# Patient Record
Sex: Female | Born: 1980 | Race: White | Hispanic: No | Smoking: Former smoker
Health system: Southern US, Community
[De-identification: ages and names within clinical notes are randomized; demographics above are authoritative.]

## PROBLEM LIST (undated history)

## (undated) DIAGNOSIS — D649 Anemia, unspecified: Secondary | ICD-10-CM

## (undated) DIAGNOSIS — Z973 Presence of spectacles and contact lenses: Secondary | ICD-10-CM

## (undated) DIAGNOSIS — N3010 Interstitial cystitis (chronic) without hematuria: Secondary | ICD-10-CM

## (undated) DIAGNOSIS — L120 Bullous pemphigoid: Secondary | ICD-10-CM

## (undated) DIAGNOSIS — R55 Syncope and collapse: Secondary | ICD-10-CM

## (undated) DIAGNOSIS — F419 Anxiety disorder, unspecified: Secondary | ICD-10-CM

## (undated) DIAGNOSIS — F329 Major depressive disorder, single episode, unspecified: Secondary | ICD-10-CM

## (undated) DIAGNOSIS — N809 Endometriosis, unspecified: Secondary | ICD-10-CM

## (undated) DIAGNOSIS — R000 Tachycardia, unspecified: Secondary | ICD-10-CM

## (undated) DIAGNOSIS — F32A Depression, unspecified: Secondary | ICD-10-CM

## (undated) DIAGNOSIS — R519 Headache, unspecified: Secondary | ICD-10-CM

## (undated) HISTORY — PX: TONSILLECTOMY: SUR1361

## (undated) HISTORY — PX: COLONOSCOPY: SHX174

---
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
## 2002-01-29 ENCOUNTER — Other Ambulatory Visit: Admission: RE | Admit: 2002-01-29 | Discharge: 2002-01-29 | Payer: Self-pay | Admitting: Gynecology

## 2002-11-29 ENCOUNTER — Emergency Department (HOSPITAL_COMMUNITY): Admission: EM | Admit: 2002-11-29 | Discharge: 2002-11-29 | Payer: Self-pay | Admitting: Emergency Medicine

## 2002-12-03 ENCOUNTER — Other Ambulatory Visit (HOSPITAL_COMMUNITY): Admission: RE | Admit: 2002-12-03 | Discharge: 2002-12-14 | Payer: Self-pay | Admitting: Psychiatry

## 2003-04-19 ENCOUNTER — Other Ambulatory Visit: Admission: RE | Admit: 2003-04-19 | Discharge: 2003-04-19 | Payer: Self-pay | Admitting: Gynecology

## 2004-06-21 HISTORY — PX: COLONOSCOPY WITH ESOPHAGOGASTRODUODENOSCOPY (EGD): SHX5779

## 2005-03-30 ENCOUNTER — Ambulatory Visit: Payer: Self-pay | Admitting: Internal Medicine

## 2005-04-06 ENCOUNTER — Other Ambulatory Visit: Admission: RE | Admit: 2005-04-06 | Discharge: 2005-04-06 | Payer: Self-pay | Admitting: Gynecology

## 2005-05-26 ENCOUNTER — Ambulatory Visit: Payer: Self-pay | Admitting: Internal Medicine

## 2005-06-18 ENCOUNTER — Ambulatory Visit: Payer: Self-pay | Admitting: Internal Medicine

## 2005-11-22 ENCOUNTER — Ambulatory Visit: Payer: Self-pay | Admitting: Internal Medicine

## 2006-04-12 ENCOUNTER — Ambulatory Visit: Payer: Self-pay | Admitting: Internal Medicine

## 2006-04-20 ENCOUNTER — Other Ambulatory Visit: Admission: RE | Admit: 2006-04-20 | Discharge: 2006-04-20 | Payer: Self-pay | Admitting: Gynecology

## 2006-05-23 ENCOUNTER — Ambulatory Visit: Payer: Self-pay | Admitting: Internal Medicine

## 2006-08-01 ENCOUNTER — Ambulatory Visit: Payer: Self-pay | Admitting: Internal Medicine

## 2006-09-12 ENCOUNTER — Ambulatory Visit: Payer: Self-pay | Admitting: Internal Medicine

## 2006-10-11 DIAGNOSIS — R51 Headache: Secondary | ICD-10-CM

## 2006-10-11 DIAGNOSIS — R519 Headache, unspecified: Secondary | ICD-10-CM | POA: Insufficient documentation

## 2006-10-11 DIAGNOSIS — F411 Generalized anxiety disorder: Secondary | ICD-10-CM | POA: Insufficient documentation

## 2006-10-11 DIAGNOSIS — D649 Anemia, unspecified: Secondary | ICD-10-CM | POA: Insufficient documentation

## 2007-04-07 ENCOUNTER — Telehealth (INDEPENDENT_AMBULATORY_CARE_PROVIDER_SITE_OTHER): Payer: Self-pay | Admitting: *Deleted

## 2007-04-10 ENCOUNTER — Ambulatory Visit: Payer: Self-pay | Admitting: Internal Medicine

## 2007-04-10 DIAGNOSIS — M549 Dorsalgia, unspecified: Secondary | ICD-10-CM | POA: Insufficient documentation

## 2007-04-10 DIAGNOSIS — R7309 Other abnormal glucose: Secondary | ICD-10-CM

## 2007-04-10 LAB — CONVERTED CEMR LAB: Glucose, Bld: 218 mg/dL

## 2007-04-12 ENCOUNTER — Telehealth (INDEPENDENT_AMBULATORY_CARE_PROVIDER_SITE_OTHER): Payer: Self-pay | Admitting: *Deleted

## 2007-04-12 LAB — CONVERTED CEMR LAB
BUN: 8 mg/dL (ref 6–23)
CO2: 30 meq/L (ref 19–32)
Calcium: 9.4 mg/dL (ref 8.4–10.5)
Chloride: 102 meq/L (ref 96–112)
Creatinine, Ser: 0.8 mg/dL (ref 0.4–1.2)
GFR calc Af Amer: 113 mL/min
GFR calc non Af Amer: 94 mL/min
Glucose, Bld: 184 mg/dL — ABNORMAL HIGH (ref 70–99)
Hgb A1c MFr Bld: 5.5 % (ref 4.6–6.0)
Potassium: 4.4 meq/L (ref 3.5–5.1)
Sodium: 138 meq/L (ref 135–145)
TSH: 0.55 microintl units/mL (ref 0.35–5.50)

## 2007-04-13 ENCOUNTER — Encounter (INDEPENDENT_AMBULATORY_CARE_PROVIDER_SITE_OTHER): Payer: Self-pay | Admitting: *Deleted

## 2007-04-25 ENCOUNTER — Other Ambulatory Visit: Admission: RE | Admit: 2007-04-25 | Discharge: 2007-04-25 | Payer: Self-pay | Admitting: Gynecology

## 2007-04-28 ENCOUNTER — Encounter: Payer: Self-pay | Admitting: Internal Medicine

## 2007-05-03 ENCOUNTER — Telehealth: Payer: Self-pay | Admitting: Internal Medicine

## 2007-06-30 ENCOUNTER — Encounter: Payer: Self-pay | Admitting: Internal Medicine

## 2007-07-26 ENCOUNTER — Telehealth: Payer: Self-pay | Admitting: Internal Medicine

## 2007-09-04 ENCOUNTER — Ambulatory Visit: Payer: Self-pay | Admitting: Internal Medicine

## 2007-09-04 ENCOUNTER — Encounter (INDEPENDENT_AMBULATORY_CARE_PROVIDER_SITE_OTHER): Payer: Self-pay | Admitting: *Deleted

## 2007-09-04 LAB — CONVERTED CEMR LAB
Bilirubin Urine: NEGATIVE
Glucose, Urine, Semiquant: NEGATIVE
Inflenza A Ag: NEGATIVE
Ketones, urine, test strip: NEGATIVE
Nitrite: NEGATIVE
Protein, U semiquant: NEGATIVE
Rapid Strep: NEGATIVE
Specific Gravity, Urine: 1.01
Urobilinogen, UA: NEGATIVE

## 2007-09-05 ENCOUNTER — Encounter: Payer: Self-pay | Admitting: Internal Medicine

## 2007-10-06 ENCOUNTER — Telehealth (INDEPENDENT_AMBULATORY_CARE_PROVIDER_SITE_OTHER): Payer: Self-pay | Admitting: *Deleted

## 2007-10-06 ENCOUNTER — Ambulatory Visit: Payer: Self-pay | Admitting: Internal Medicine

## 2007-10-06 DIAGNOSIS — L509 Urticaria, unspecified: Secondary | ICD-10-CM | POA: Insufficient documentation

## 2008-02-21 ENCOUNTER — Encounter (INDEPENDENT_AMBULATORY_CARE_PROVIDER_SITE_OTHER): Payer: Self-pay | Admitting: *Deleted

## 2008-02-21 ENCOUNTER — Ambulatory Visit: Payer: Self-pay | Admitting: Internal Medicine

## 2008-02-21 LAB — CONVERTED CEMR LAB
Bilirubin Urine: NEGATIVE
Blood in Urine, dipstick: NEGATIVE
Glucose, Bld: 87 mg/dL
Glucose, Urine, Semiquant: NEGATIVE
Hemoglobin: 12.7 g/dL
Ketones, urine, test strip: NEGATIVE
Nitrite: NEGATIVE
Protein, U semiquant: NEGATIVE
Specific Gravity, Urine: 1.01
Urobilinogen, UA: 0.2
pH: 8

## 2008-02-22 ENCOUNTER — Telehealth (INDEPENDENT_AMBULATORY_CARE_PROVIDER_SITE_OTHER): Payer: Self-pay | Admitting: *Deleted

## 2008-02-23 ENCOUNTER — Encounter: Payer: Self-pay | Admitting: Internal Medicine

## 2008-02-23 LAB — CONVERTED CEMR LAB: RBC / HPF: NONE SEEN (ref <3)

## 2008-03-12 ENCOUNTER — Emergency Department (HOSPITAL_COMMUNITY): Admission: EM | Admit: 2008-03-12 | Discharge: 2008-03-12 | Payer: Self-pay | Admitting: Emergency Medicine

## 2008-03-22 ENCOUNTER — Ambulatory Visit: Payer: Self-pay | Admitting: Internal Medicine

## 2008-03-22 DIAGNOSIS — S139XXA Sprain of joints and ligaments of unspecified parts of neck, initial encounter: Secondary | ICD-10-CM | POA: Insufficient documentation

## 2008-04-02 ENCOUNTER — Telehealth: Payer: Self-pay | Admitting: Internal Medicine

## 2008-04-03 ENCOUNTER — Encounter (INDEPENDENT_AMBULATORY_CARE_PROVIDER_SITE_OTHER): Payer: Self-pay | Admitting: *Deleted

## 2008-04-17 ENCOUNTER — Ambulatory Visit: Payer: Self-pay | Admitting: Family Medicine

## 2008-04-18 ENCOUNTER — Telehealth (INDEPENDENT_AMBULATORY_CARE_PROVIDER_SITE_OTHER): Payer: Self-pay | Admitting: *Deleted

## 2008-04-18 ENCOUNTER — Encounter (INDEPENDENT_AMBULATORY_CARE_PROVIDER_SITE_OTHER): Payer: Self-pay | Admitting: *Deleted

## 2008-04-23 ENCOUNTER — Ambulatory Visit: Payer: Self-pay | Admitting: Family Medicine

## 2008-04-23 DIAGNOSIS — J020 Streptococcal pharyngitis: Secondary | ICD-10-CM | POA: Insufficient documentation

## 2008-04-23 LAB — CONVERTED CEMR LAB: Rapid Strep: POSITIVE

## 2008-05-06 ENCOUNTER — Encounter (INDEPENDENT_AMBULATORY_CARE_PROVIDER_SITE_OTHER): Payer: Self-pay | Admitting: *Deleted

## 2008-05-06 ENCOUNTER — Ambulatory Visit: Payer: Self-pay | Admitting: Internal Medicine

## 2008-05-07 ENCOUNTER — Telehealth (INDEPENDENT_AMBULATORY_CARE_PROVIDER_SITE_OTHER): Payer: Self-pay | Admitting: *Deleted

## 2008-05-08 ENCOUNTER — Encounter (INDEPENDENT_AMBULATORY_CARE_PROVIDER_SITE_OTHER): Payer: Self-pay | Admitting: *Deleted

## 2008-05-09 ENCOUNTER — Encounter (INDEPENDENT_AMBULATORY_CARE_PROVIDER_SITE_OTHER): Payer: Self-pay | Admitting: *Deleted

## 2008-05-14 ENCOUNTER — Encounter: Payer: Self-pay | Admitting: Internal Medicine

## 2008-06-21 HISTORY — PX: PELVIC LAPAROSCOPY: SHX162

## 2008-06-25 ENCOUNTER — Emergency Department (HOSPITAL_COMMUNITY): Admission: EM | Admit: 2008-06-25 | Discharge: 2008-06-25 | Payer: Self-pay | Admitting: Emergency Medicine

## 2008-06-26 ENCOUNTER — Ambulatory Visit: Payer: Self-pay | Admitting: Radiology

## 2008-06-26 ENCOUNTER — Emergency Department (HOSPITAL_BASED_OUTPATIENT_CLINIC_OR_DEPARTMENT_OTHER): Admission: EM | Admit: 2008-06-26 | Discharge: 2008-06-26 | Payer: Self-pay | Admitting: Emergency Medicine

## 2008-07-22 ENCOUNTER — Encounter: Admission: RE | Admit: 2008-07-22 | Discharge: 2008-07-22 | Payer: Self-pay | Admitting: Gastroenterology

## 2008-07-29 ENCOUNTER — Encounter: Payer: Self-pay | Admitting: Internal Medicine

## 2008-08-08 ENCOUNTER — Ambulatory Visit (HOSPITAL_COMMUNITY): Admission: RE | Admit: 2008-08-08 | Discharge: 2008-08-08 | Payer: Self-pay | Admitting: Obstetrics and Gynecology

## 2008-08-22 ENCOUNTER — Ambulatory Visit: Payer: Self-pay | Admitting: Internal Medicine

## 2008-08-22 ENCOUNTER — Observation Stay (HOSPITAL_COMMUNITY): Admission: EM | Admit: 2008-08-22 | Discharge: 2008-08-23 | Payer: Self-pay | Admitting: Emergency Medicine

## 2008-09-05 DIAGNOSIS — R002 Palpitations: Secondary | ICD-10-CM

## 2008-09-06 ENCOUNTER — Ambulatory Visit: Payer: Self-pay | Admitting: Internal Medicine

## 2008-09-06 ENCOUNTER — Encounter: Payer: Self-pay | Admitting: Internal Medicine

## 2008-10-23 ENCOUNTER — Encounter (INDEPENDENT_AMBULATORY_CARE_PROVIDER_SITE_OTHER): Payer: Self-pay | Admitting: *Deleted

## 2008-12-19 ENCOUNTER — Ambulatory Visit: Payer: Self-pay | Admitting: Internal Medicine

## 2008-12-19 DIAGNOSIS — R55 Syncope and collapse: Secondary | ICD-10-CM | POA: Insufficient documentation

## 2009-04-27 ENCOUNTER — Emergency Department (HOSPITAL_BASED_OUTPATIENT_CLINIC_OR_DEPARTMENT_OTHER): Admission: EM | Admit: 2009-04-27 | Discharge: 2009-04-27 | Payer: Self-pay | Admitting: Emergency Medicine

## 2009-05-04 IMAGING — CT CT ENTERO ABD W/CM
2 of 5 series · 17 of 46 positions shown, 19 images · IV contrast (VOLUMEN & [ID] OMNI 300)
Comparison: CT abdomen pelvis of 06/26/2008

CT ABDOMEN:

CLINICAL DATA: Abdominal pain, diarrhea for 5 weeks, some weight
loss

CT ABDOMEN AND PELVIS WITH CONTRAST (CT ENTEROGRAPHY)
TECHNIQUE: Multidetector CT of the abdomen and pelvis during bolus
administration of intravenous contrast. Negative oral contrast
VoLumen was given.
Contrast:  100 ml Umnipaque-444 intravenously and 6630 ml VoLumen
orally.

[Series 3: enterography · axial · 0.64mm/px · z∈[-343,-0]mm · 14 of 155 slices shown, 16 images]
[im 9/155  soft-tissue]
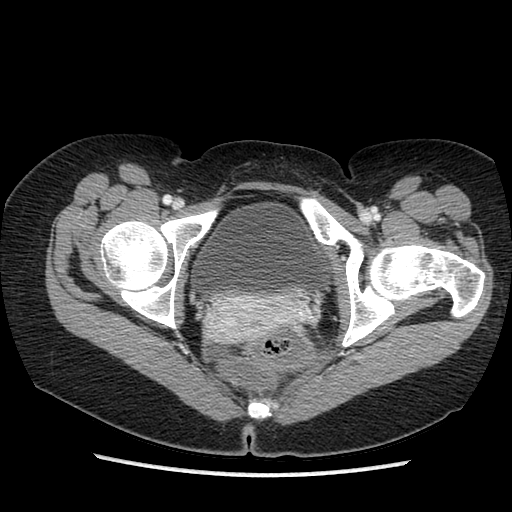
[im 9/155  bone]
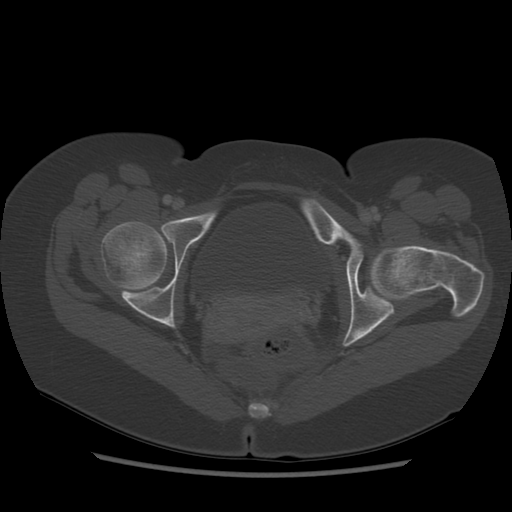
[im 18/155  soft-tissue]
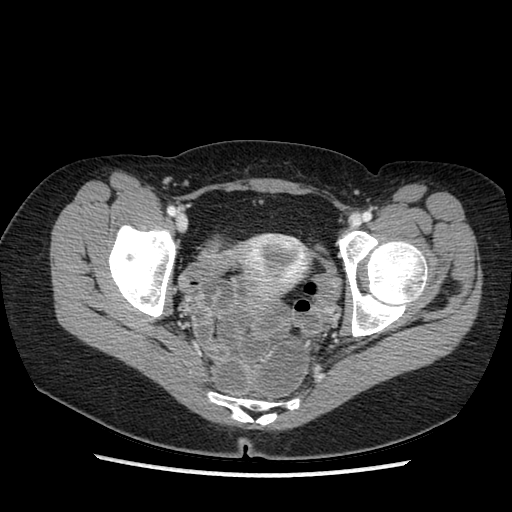
[im 35/155  soft-tissue]
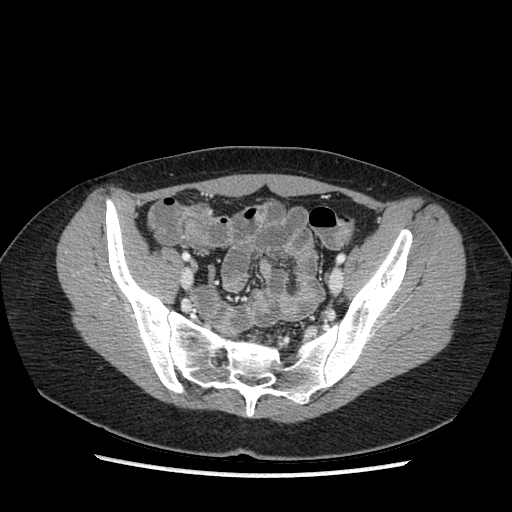
[im 43/155  soft-tissue]
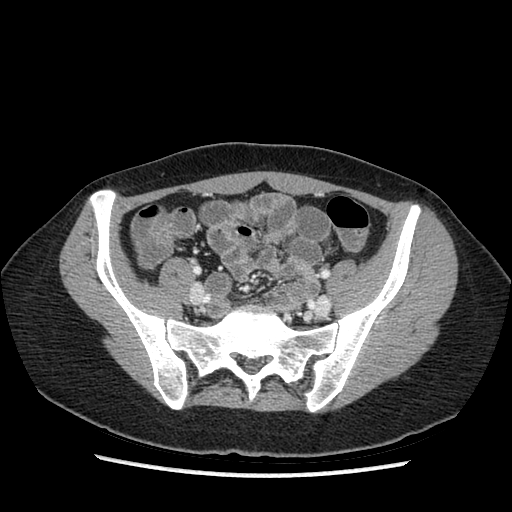
[im 52/155  soft-tissue]
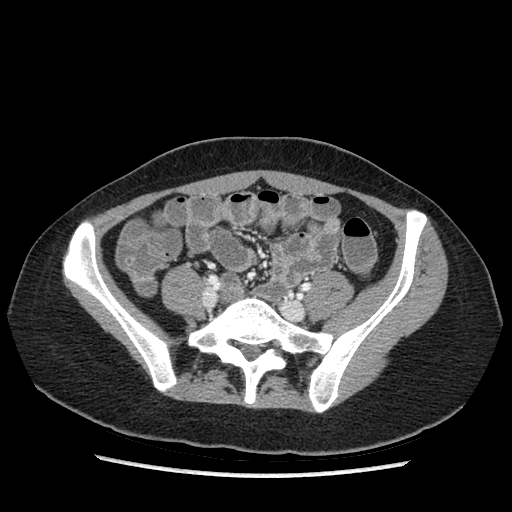
[im 60/155  soft-tissue]
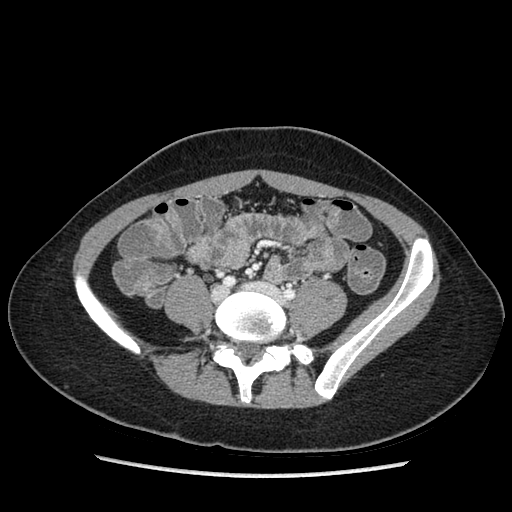
[im 69/155  soft-tissue]
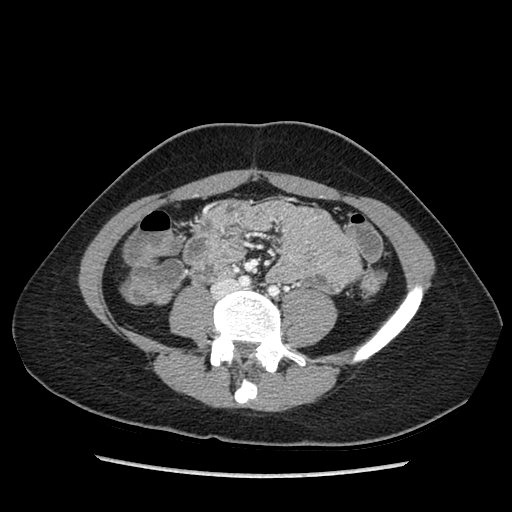
[im 86/155  soft-tissue]
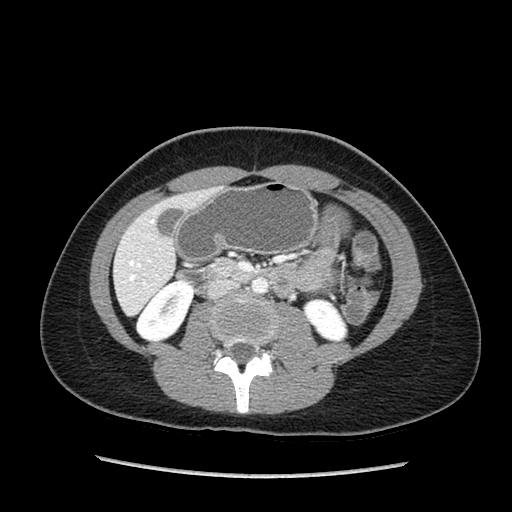
[im 95/155  soft-tissue]
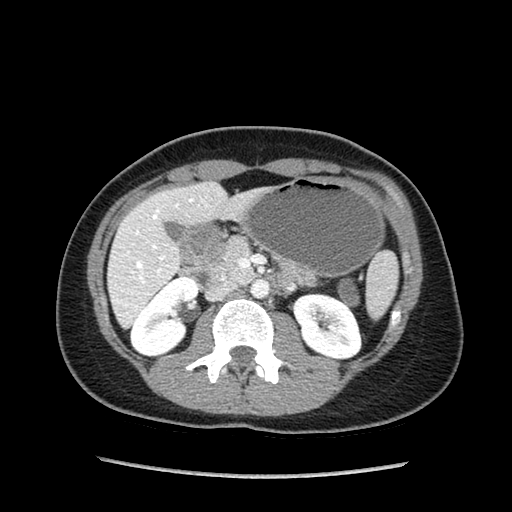
[im 95/155  bone]
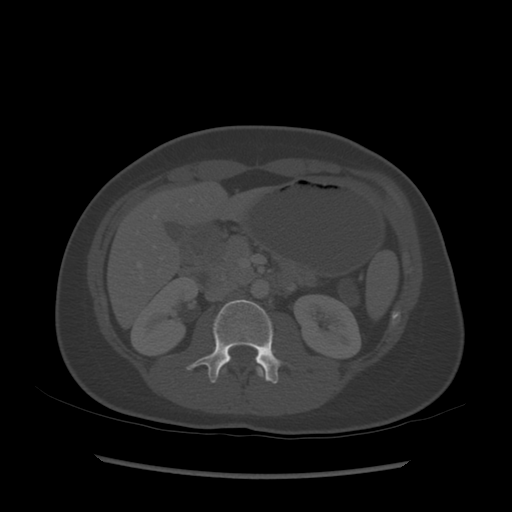
[im 103/155  soft-tissue]
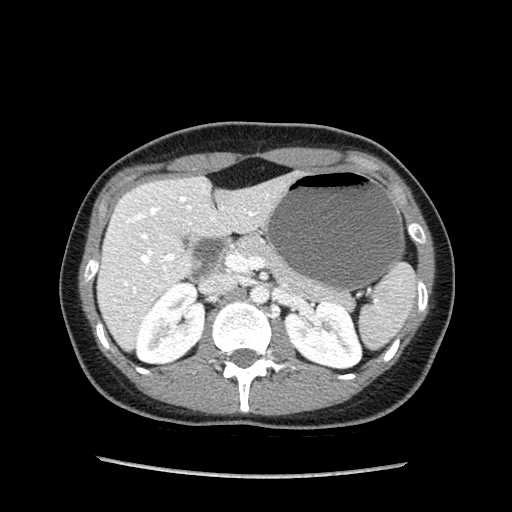
[im 112/155  soft-tissue]
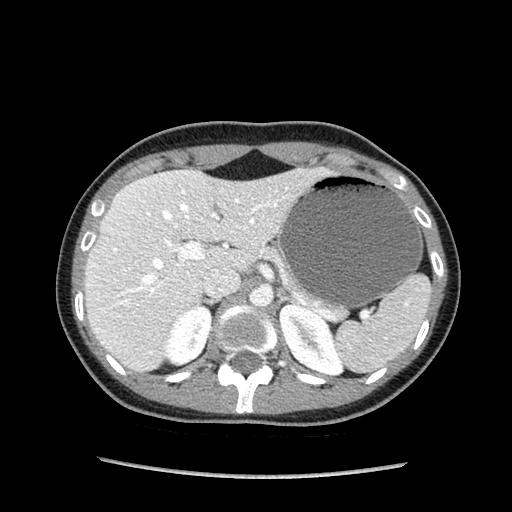
[im 120/155  soft-tissue]
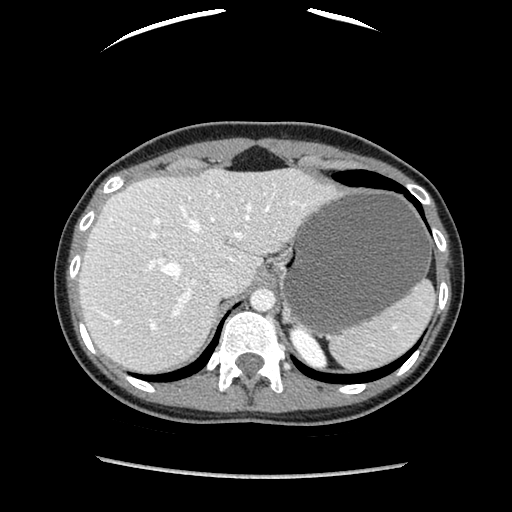
[im 137/155  soft-tissue]
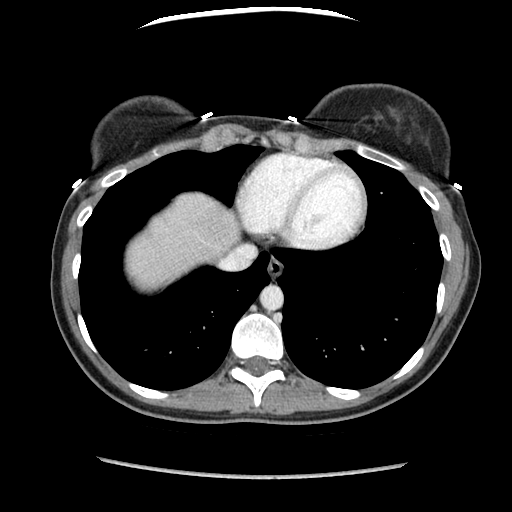
[im 146/155  soft-tissue]
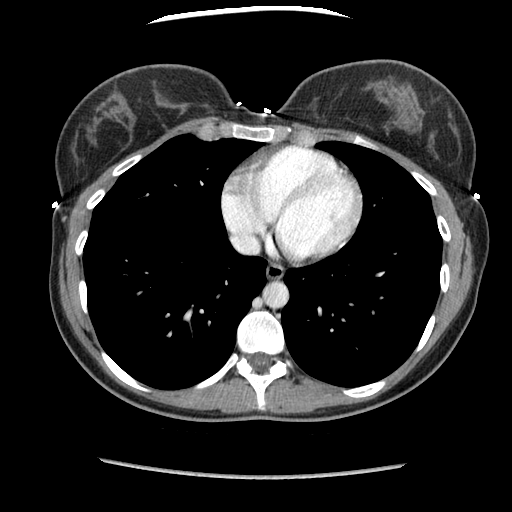

[Series 602: sagittal body · sagittal · 0.84mm/px · 3 of 133 slices shown]
[im 45/133  soft-tissue]
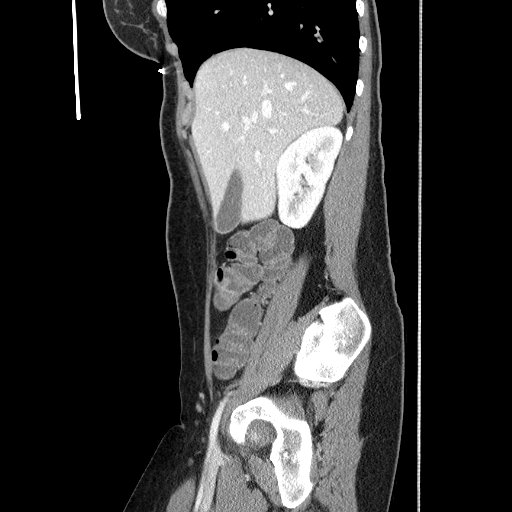
[im 59/133  soft-tissue]
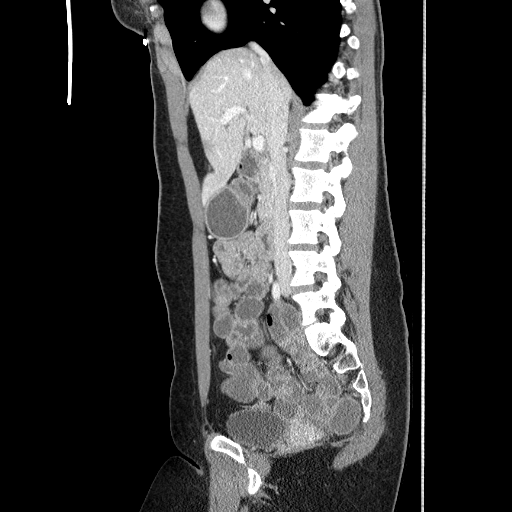
[im 74/133  soft-tissue]
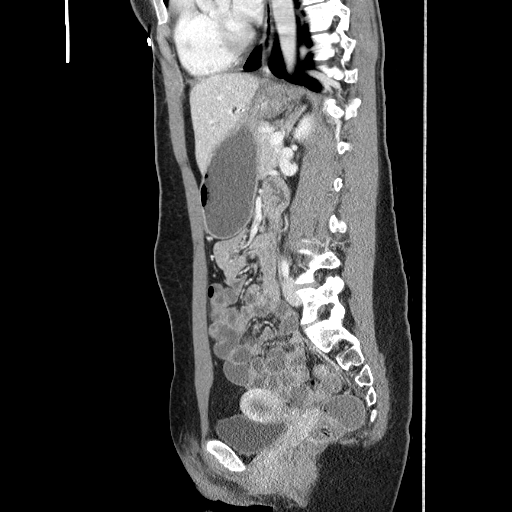

[17 of 46 positions shown; findings below may reference images not displayed]

FINDINGS: The lung bases are clear.  The liver enhances with no
focal abnormality and no ductal dilatation is seen.  No calcified
gallstones are noted.  The pancreas is normal in size and the
pancreatic duct is not dilated.  The adrenal glands and spleen
appear normal.  The kidneys enhance and on delayed images the
pelvocaliceal systems appear normal.  The proximal ureters are not
dilated.  The abdominal aorta is normal in caliber.  No adenopathy
is seen.
IMPRESSION: No significant abnormality on CT of the abdomen.

CT PELVIS:
FINDINGS: The terminal ileum is well visualized, and well distended
by Volumen.  There is no evidence of mural enhancement and no
mucosal edema is seen.  The colon appears grossly normal.  The
uterus is normal in size.  No adnexal lesion is seen and no free
fluid is noted.  The appendix is faintly visualized and appears
normal.  No bony abnormality is seen.
IMPRESSION: No significant abnormality on CT of the pelvis.  The terminal ileum
is well seen and well distended, with no abnormality noted.

REF:G3 DICTATED: 07/22/2008 [DATE]

## 2009-05-05 ENCOUNTER — Emergency Department (HOSPITAL_BASED_OUTPATIENT_CLINIC_OR_DEPARTMENT_OTHER): Admission: EM | Admit: 2009-05-05 | Discharge: 2009-05-05 | Payer: Self-pay | Admitting: Emergency Medicine

## 2009-08-19 ENCOUNTER — Telehealth: Payer: Self-pay | Admitting: Internal Medicine

## 2010-01-21 ENCOUNTER — Emergency Department (HOSPITAL_BASED_OUTPATIENT_CLINIC_OR_DEPARTMENT_OTHER): Admission: EM | Admit: 2010-01-21 | Discharge: 2010-01-21 | Payer: Self-pay | Admitting: Emergency Medicine

## 2010-02-04 ENCOUNTER — Emergency Department (HOSPITAL_BASED_OUTPATIENT_CLINIC_OR_DEPARTMENT_OTHER): Admission: EM | Admit: 2010-02-04 | Discharge: 2010-02-04 | Payer: Self-pay | Admitting: Emergency Medicine

## 2010-02-05 ENCOUNTER — Emergency Department (HOSPITAL_BASED_OUTPATIENT_CLINIC_OR_DEPARTMENT_OTHER): Admission: EM | Admit: 2010-02-05 | Discharge: 2010-02-05 | Payer: Self-pay | Admitting: Emergency Medicine

## 2010-02-18 ENCOUNTER — Emergency Department (HOSPITAL_BASED_OUTPATIENT_CLINIC_OR_DEPARTMENT_OTHER): Admission: EM | Admit: 2010-02-18 | Discharge: 2010-02-18 | Payer: Self-pay | Admitting: Emergency Medicine

## 2010-02-18 ENCOUNTER — Encounter (INDEPENDENT_AMBULATORY_CARE_PROVIDER_SITE_OTHER): Payer: Self-pay | Admitting: *Deleted

## 2010-02-18 ENCOUNTER — Telehealth: Payer: Self-pay | Admitting: Internal Medicine

## 2010-03-21 ENCOUNTER — Ambulatory Visit: Payer: Self-pay | Admitting: Diagnostic Radiology

## 2010-03-21 ENCOUNTER — Emergency Department (HOSPITAL_BASED_OUTPATIENT_CLINIC_OR_DEPARTMENT_OTHER): Admission: EM | Admit: 2010-03-21 | Discharge: 2010-03-21 | Payer: Self-pay | Admitting: Emergency Medicine

## 2010-04-14 ENCOUNTER — Ambulatory Visit: Payer: Self-pay | Admitting: Diagnostic Radiology

## 2010-04-14 ENCOUNTER — Emergency Department (HOSPITAL_BASED_OUTPATIENT_CLINIC_OR_DEPARTMENT_OTHER): Admission: EM | Admit: 2010-04-14 | Discharge: 2010-04-15 | Payer: Self-pay | Admitting: Emergency Medicine

## 2010-06-20 ENCOUNTER — Emergency Department (HOSPITAL_BASED_OUTPATIENT_CLINIC_OR_DEPARTMENT_OTHER)
Admission: EM | Admit: 2010-06-20 | Discharge: 2010-06-20 | Payer: Self-pay | Source: Home / Self Care | Admitting: Emergency Medicine

## 2010-07-21 NOTE — Miscellaneous (Signed)
Summary: Florinef 0.1mg  bid  Clinical Lists Changes  Medications: Added new medication of FLUDROCORTISONE ACETATE 0.1 MG TABS (FLUDROCORTISONE ACETATE) one by mouth bid - Signed Rx of FLUDROCORTISONE ACETATE 0.1 MG TABS (FLUDROCORTISONE ACETATE) one by mouth bid;  #60 x 3;  Signed;  Entered by: Dennis Bast, RN, BSN;  Authorized by: Laren Boom, MD, [facility];  Method used: Electronically to [facility] [phone]*, [address], [place], [place], [place]  [address], Ph: [phone], Fax: [phone]    Prescriptions: FLUDROCORTISONE ACETATE 0.1 MG TABS (FLUDROCORTISONE ACETATE) one by mouth bid  #60 x 3   Entered by:   Dennis Bast, RN, BSN   Authorized by:   Laren Boom, MD, [facility]   Signed by:   Dennis Bast, RN, BSN on 02/18/2010   Method used:   Electronically to        [facility] [phone]* (retail)       [address]       [place], [place]  [address]       Ph: [phone]       Fax: [phone]   RxID:   [id number]

## 2010-07-21 NOTE — Progress Notes (Signed)
Summary: letter for work  Phone Note Call from Patient Call back at [employer] [phone]   Caller: Patient Reason for Call: Talk to Nurse Summary of Call: almost blacked out at work, will need letter to go back for tomorrow Initial call taken by: Migdalia Dk,  August 19, 2009 12:13 PM  Follow-up for Phone Call        started having palpatations today then  got dizzy.  HR was at 200 bpm per a therapist.  Has been taking Miralax over the last couple of days. Needs to be rehydrated and may return to work on 08/20/09.  Fax note [phone] atn. Nyra Capes. Explained to pt that she can't take medication such as Miralax with her condition.  She understands Dennis Bast, RN, BSN  August 19, 2009 12:45 PM

## 2010-07-21 NOTE — Progress Notes (Signed)
Summary: wants to be admitted for panic like attacks associated w/syncope  Phone Note Call from Patient   Caller: Patient Reason for Call: Talk to Nurse Summary of Call: pt wants dr taylor to admit her to [facility] today-having syncope-with panic like attacks -[phone] Initial call taken by: Glynda Jaeger,  February 18, 2010 9:31 AM  Follow-up for Phone Call        pt calling back-wants to be admitted as soon as possible Glynda Jaeger  February 18, 2010 10:08 AM 02/18/10--10:am--pt calling stating she is having syncope, nausea, and she can't keep anything down-- states she has "passed out" 2 x today--psychiatrist stated she should call dr taylor--pt states she just wants to be admitted to hospital to figure out what's going on, and doesn't want to go to ED for fluids and then be sent home--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  I explained to her that Dr Ladona Ridgel wanted to start her on Florinef 0.1mg  two times a day and to call me after her apt to let me know what is going on with her other medications.  She agreed to do so

## 2010-07-26 ENCOUNTER — Emergency Department (HOSPITAL_BASED_OUTPATIENT_CLINIC_OR_DEPARTMENT_OTHER)
Admission: EM | Admit: 2010-07-26 | Discharge: 2010-07-26 | Disposition: A | Discharge status: Home / Self Care | Payer: BC Managed Care – PPO | Attending: Emergency Medicine | Admitting: Emergency Medicine

## 2010-07-26 DIAGNOSIS — T1500XA Foreign body in cornea, unspecified eye, initial encounter: Secondary | ICD-10-CM | POA: Insufficient documentation

## 2010-07-26 DIAGNOSIS — IMO0002 Reserved for concepts with insufficient information to code with codable children: Secondary | ICD-10-CM | POA: Insufficient documentation

## 2010-07-26 DIAGNOSIS — Y92009 Unspecified place in unspecified non-institutional (private) residence as the place of occurrence of the external cause: Secondary | ICD-10-CM | POA: Insufficient documentation

## 2010-07-26 DIAGNOSIS — H5710 Ocular pain, unspecified eye: Secondary | ICD-10-CM | POA: Insufficient documentation

## 2010-08-31 LAB — GC/CHLAMYDIA PROBE AMP, GENITAL
Chlamydia, DNA Probe: NEGATIVE
GC Probe Amp, Genital: NEGATIVE

## 2010-08-31 LAB — PREGNANCY, URINE: Preg Test, Ur: NEGATIVE

## 2010-08-31 LAB — URINALYSIS, ROUTINE W REFLEX MICROSCOPIC
Bilirubin Urine: NEGATIVE
Glucose, UA: NEGATIVE mg/dL
Ketones, ur: 15 mg/dL — AB
Nitrite: NEGATIVE
Protein, ur: 100 mg/dL — AB
Specific Gravity, Urine: 1.019 (ref 1.005–1.030)
Urobilinogen, UA: 0.2 mg/dL (ref 0.0–1.0)
pH: 7 (ref 5.0–8.0)

## 2010-08-31 LAB — WET PREP, GENITAL
Trich, Wet Prep: NONE SEEN
Yeast Wet Prep HPF POC: NONE SEEN

## 2010-08-31 LAB — URINE MICROSCOPIC-ADD ON

## 2010-08-31 LAB — URINE CULTURE
Colony Count: >=100000
Culture  Setup Time: 201112312350

## 2010-09-02 LAB — CBC
HCT: 40 % (ref 36.0–46.0)
Hemoglobin: 13.9 g/dL (ref 12.0–15.0)
MCH: 30.9 pg (ref 26.0–34.0)
MCHC: 34.6 g/dL (ref 30.0–36.0)
MCV: 89.1 fL (ref 78.0–100.0)
Platelets: 255 10*3/uL (ref 150–400)
RBC: 4.49 MIL/uL (ref 3.87–5.11)
RDW: 12.4 % (ref 11.5–15.5)
WBC: 8.9 10*3/uL (ref 4.0–10.5)

## 2010-09-02 LAB — COMPREHENSIVE METABOLIC PANEL
ALT: 36 U/L — ABNORMAL HIGH (ref 0–35)
AST: 31 U/L (ref 0–37)
Albumin: 4.8 g/dL (ref 3.5–5.2)
Alkaline Phosphatase: 61 U/L (ref 39–117)
BUN: 18 mg/dL (ref 6–23)
CO2: 26 mEq/L (ref 19–32)
Calcium: 9.8 mg/dL (ref 8.4–10.5)
Chloride: 102 mEq/L (ref 96–112)
Creatinine, Ser: 0.6 mg/dL (ref 0.4–1.2)
GFR calc Af Amer: >60 mL/min (ref >60)
GFR calc non Af Amer: >60 mL/min (ref >60)
Glucose, Bld: 83 mg/dL (ref 70–99)
Potassium: 3.9 mEq/L (ref 3.5–5.1)
Sodium: 141 mEq/L (ref 135–145)
Total Bilirubin: 0.9 mg/dL (ref 0.3–1.2)
Total Protein: 8.1 g/dL (ref 6.0–8.3)

## 2010-09-02 LAB — DIFFERENTIAL
Basophils Absolute: 0.2 10*3/uL — ABNORMAL HIGH (ref 0.0–0.1)
Basophils Relative: 3 % — ABNORMAL HIGH (ref 0–1)
Lymphocytes Relative: 34 % (ref 12–46)
Neutro Abs: 5 10*3/uL (ref 1.7–7.7)
Neutrophils Relative %: 57 % (ref 43–77)

## 2010-09-02 LAB — URINALYSIS, ROUTINE W REFLEX MICROSCOPIC
Bilirubin Urine: NEGATIVE
Glucose, UA: NEGATIVE mg/dL
Hgb urine dipstick: NEGATIVE
Ketones, ur: NEGATIVE mg/dL
Nitrite: NEGATIVE
Protein, ur: NEGATIVE mg/dL
Specific Gravity, Urine: 1.011 (ref 1.005–1.030)
Urobilinogen, UA: 0.2 mg/dL (ref 0.0–1.0)
pH: 8.5 — ABNORMAL HIGH (ref 5.0–8.0)

## 2010-09-02 LAB — LIPASE, BLOOD: Lipase: 95 U/L (ref 23–300)

## 2010-09-02 LAB — PREGNANCY, URINE: Preg Test, Ur: NEGATIVE

## 2010-09-03 LAB — COMPREHENSIVE METABOLIC PANEL
ALT: 35 U/L (ref 0–35)
AST: 29 U/L (ref 0–37)
Albumin: 4.3 g/dL (ref 3.5–5.2)
Alkaline Phosphatase: 61 U/L (ref 39–117)
CO2: 26 mEq/L (ref 19–32)
Chloride: 104 mEq/L (ref 96–112)
GFR calc Af Amer: >60 mL/min (ref >60)
GFR calc non Af Amer: >60 mL/min (ref >60)
Potassium: 4.1 mEq/L (ref 3.5–5.1)
Total Bilirubin: 0.8 mg/dL (ref 0.3–1.2)

## 2010-09-03 LAB — WET PREP, GENITAL
Clue Cells Wet Prep HPF POC: NONE SEEN
Trich, Wet Prep: NONE SEEN

## 2010-09-03 LAB — DIFFERENTIAL
Basophils Absolute: 0.2 10*3/uL — ABNORMAL HIGH (ref 0.0–0.1)
Basophils Relative: 2 % — ABNORMAL HIGH (ref 0–1)
Eosinophils Absolute: 0.1 10*3/uL (ref 0.0–0.7)
Eosinophils Relative: 2 % (ref 0–5)
Monocytes Absolute: 0.4 10*3/uL (ref 0.1–1.0)

## 2010-09-03 LAB — URINALYSIS, ROUTINE W REFLEX MICROSCOPIC
Glucose, UA: NEGATIVE mg/dL
Hgb urine dipstick: NEGATIVE
Ketones, ur: NEGATIVE mg/dL
Protein, ur: NEGATIVE mg/dL
Urobilinogen, UA: 0.2 mg/dL (ref 0.0–1.0)

## 2010-09-03 LAB — CBC
Hemoglobin: 13.9 g/dL (ref 12.0–15.0)
MCH: 29.8 pg (ref 26.0–34.0)
Platelets: 266 10*3/uL (ref 150–400)
RBC: 4.67 MIL/uL (ref 3.87–5.11)
WBC: 8.5 10*3/uL (ref 4.0–10.5)

## 2010-09-03 LAB — GLUCOSE, CAPILLARY: Glucose-Capillary: 72 mg/dL (ref 70–99)

## 2010-09-03 LAB — GC/CHLAMYDIA PROBE AMP, GENITAL: GC Probe Amp, Genital: NEGATIVE

## 2010-09-04 LAB — URINE MICROSCOPIC-ADD ON

## 2010-09-04 LAB — BASIC METABOLIC PANEL
BUN: 11 mg/dL (ref 6–23)
BUN: 9 mg/dL (ref 6–23)
Calcium: 10.4 mg/dL (ref 8.4–10.5)
Calcium: 9.7 mg/dL (ref 8.4–10.5)
Chloride: 101 mEq/L (ref 96–112)
Creatinine, Ser: 0.8 mg/dL (ref 0.4–1.2)
Creatinine, Ser: 0.8 mg/dL (ref 0.4–1.2)
Creatinine, Ser: 0.9 mg/dL (ref 0.4–1.2)
GFR calc Af Amer: >60 mL/min (ref >60)
GFR calc non Af Amer: >60 mL/min (ref >60)
GFR calc non Af Amer: >60 mL/min (ref >60)
GFR calc non Af Amer: >60 mL/min (ref >60)
Glucose, Bld: 86 mg/dL (ref 70–99)
Potassium: 3.5 mEq/L (ref 3.5–5.1)

## 2010-09-04 LAB — URINALYSIS, ROUTINE W REFLEX MICROSCOPIC
Bilirubin Urine: NEGATIVE
Bilirubin Urine: NEGATIVE
Hgb urine dipstick: NEGATIVE
Hgb urine dipstick: NEGATIVE
Ketones, ur: 15 mg/dL — AB
Ketones, ur: >80 mg/dL — AB
Ketones, ur: 15 mg/dL — AB
Nitrite: NEGATIVE
Protein, ur: NEGATIVE mg/dL
Specific Gravity, Urine: 1.02 (ref 1.005–1.030)
Specific Gravity, Urine: 1.022 (ref 1.005–1.030)
Urobilinogen, UA: 1 mg/dL (ref 0.0–1.0)
Urobilinogen, UA: 1 mg/dL (ref 0.0–1.0)
pH: 7.5 (ref 5.0–8.0)

## 2010-09-04 LAB — PREGNANCY, URINE: Preg Test, Ur: NEGATIVE

## 2010-09-04 LAB — CBC
MCV: 88.6 fL (ref 78.0–100.0)
Platelets: 276 10*3/uL (ref 150–400)
RDW: 12.9 % (ref 11.5–15.5)
WBC: 7.5 10*3/uL (ref 4.0–10.5)

## 2010-09-04 LAB — URINE CULTURE
Colony Count: 40000
Culture  Setup Time: 201108181804

## 2010-09-04 LAB — POCT TOXICOLOGY PANEL: Benzodiazepines: POSITIVE

## 2010-09-04 LAB — DIFFERENTIAL
Basophils Absolute: 0.1 10*3/uL (ref 0.0–0.1)
Eosinophils Relative: 2 % (ref 0–5)
Lymphocytes Relative: 26 % (ref 12–46)

## 2010-09-23 LAB — URINALYSIS, ROUTINE W REFLEX MICROSCOPIC
Bilirubin Urine: NEGATIVE
Hgb urine dipstick: NEGATIVE
Ketones, ur: NEGATIVE mg/dL
Nitrite: NEGATIVE
Protein, ur: NEGATIVE mg/dL
Urobilinogen, UA: 1 mg/dL (ref 0.0–1.0)

## 2010-10-01 LAB — POCT I-STAT, CHEM 8
BUN: 7 mg/dL (ref 6–23)
Calcium, Ion: 1.13 mmol/L (ref 1.12–1.32)
Chloride: 109 mEq/L (ref 96–112)
Glucose, Bld: 109 mg/dL — ABNORMAL HIGH (ref 70–99)

## 2010-10-01 LAB — BASIC METABOLIC PANEL
BUN: 6 mg/dL (ref 6–23)
BUN: 8 mg/dL (ref 6–23)
CO2: 19 mEq/L (ref 19–32)
Chloride: 114 mEq/L — ABNORMAL HIGH (ref 96–112)
Creatinine, Ser: 0.76 mg/dL (ref 0.4–1.2)
Creatinine, Ser: 0.85 mg/dL (ref 0.4–1.2)
GFR calc non Af Amer: >60 mL/min (ref >60)
Potassium: 3.6 mEq/L (ref 3.5–5.1)

## 2010-10-01 LAB — HEPATIC FUNCTION PANEL
ALT: 228 U/L — ABNORMAL HIGH (ref 0–35)
Alkaline Phosphatase: 61 U/L (ref 39–117)
Bilirubin, Direct: 0.2 mg/dL (ref 0.0–0.3)
Indirect Bilirubin: 0.8 mg/dL (ref 0.3–0.9)

## 2010-10-01 LAB — DIFFERENTIAL
Basophils Absolute: 0 10*3/uL (ref 0.0–0.1)
Eosinophils Absolute: 0.1 10*3/uL (ref 0.0–0.7)
Eosinophils Relative: 1 % (ref 0–5)
Lymphocytes Relative: 26 % (ref 12–46)
Monocytes Absolute: 0.3 10*3/uL (ref 0.1–1.0)

## 2010-10-01 LAB — URINALYSIS, ROUTINE W REFLEX MICROSCOPIC
Glucose, UA: NEGATIVE mg/dL
Hgb urine dipstick: NEGATIVE
Ketones, ur: 15 mg/dL — AB
pH: 5.5 (ref 5.0–8.0)

## 2010-10-01 LAB — CBC
HCT: 40.5 % (ref 36.0–46.0)
Hemoglobin: 13.5 g/dL (ref 12.0–15.0)
MCV: 88.1 fL (ref 78.0–100.0)
RDW: 14.1 % (ref 11.5–15.5)

## 2010-10-01 LAB — CK: Total CK: 53 U/L (ref 7–177)

## 2010-10-05 LAB — POCT I-STAT, CHEM 8
BUN: 7 mg/dL (ref 6–23)
Calcium, Ion: 1.09 mmol/L — ABNORMAL LOW (ref 1.12–1.32)
Chloride: 109 mEq/L (ref 96–112)

## 2010-10-05 LAB — BASIC METABOLIC PANEL
GFR calc Af Amer: >60 mL/min (ref >60)
GFR calc non Af Amer: >60 mL/min (ref >60)
Glucose, Bld: 72 mg/dL (ref 70–99)
Potassium: 4.5 mEq/L (ref 3.5–5.1)
Sodium: 139 mEq/L (ref 135–145)

## 2010-10-05 LAB — DIFFERENTIAL
Basophils Absolute: 0.2 10*3/uL — ABNORMAL HIGH (ref 0.0–0.1)
Basophils Relative: 2 % — ABNORMAL HIGH (ref 0–1)
Eosinophils Absolute: 0.1 10*3/uL (ref 0.0–0.7)
Eosinophils Relative: 1 % (ref 0–5)

## 2010-10-05 LAB — URINALYSIS, ROUTINE W REFLEX MICROSCOPIC
Bilirubin Urine: NEGATIVE
Bilirubin Urine: NEGATIVE
Glucose, UA: NEGATIVE mg/dL
Ketones, ur: 40 mg/dL — AB
Nitrite: NEGATIVE
Specific Gravity, Urine: 1.007 (ref 1.005–1.030)
pH: 7 (ref 5.0–8.0)
pH: 7 (ref 5.0–8.0)

## 2010-10-05 LAB — CBC
HCT: 39.8 % (ref 36.0–46.0)
Hemoglobin: 13.6 g/dL (ref 12.0–15.0)
MCHC: 34.1 g/dL (ref 30.0–36.0)
MCV: 87.7 fL (ref 78.0–100.0)
Platelets: 280 10*3/uL (ref 150–400)
RBC: 4.54 MIL/uL (ref 3.87–5.11)
RDW: 12.7 % (ref 11.5–15.5)
WBC: 10.5 10*3/uL (ref 4.0–10.5)

## 2010-10-05 LAB — PREGNANCY, URINE: Preg Test, Ur: NEGATIVE

## 2010-10-05 LAB — GLUCOSE, CAPILLARY
Glucose-Capillary: 73 mg/dL (ref 70–99)
Glucose-Capillary: 79 mg/dL (ref 70–99)

## 2010-10-06 LAB — CBC
MCHC: 33.6 g/dL (ref 30.0–36.0)
MCV: 88.1 fL (ref 78.0–100.0)
Platelets: 255 10*3/uL (ref 150–400)
RBC: 4.91 MIL/uL (ref 3.87–5.11)
RDW: 13.9 % (ref 11.5–15.5)

## 2010-10-06 LAB — PREGNANCY, URINE: Preg Test, Ur: NEGATIVE

## 2010-11-03 NOTE — Op Note (Signed)
NAMESHARAYAH, Taylor [name]               ACCOUNT NO.:  [id number]   MEDICAL RECORD NO.:  [id number]          PATIENT TYPE:  AMB   LOCATION:  SDC                           FACILITY:  [facility]   PHYSICIAN:  Malva Limes, M.D.    DATE OF BIRTH:  05/30/1981   DATE OF PROCEDURE:  08/08/2008  DATE OF DISCHARGE:                               OPERATIVE REPORT   PREOPERATIVE DIAGNOSIS:  Chronic pelvic abdominal pain.   POSTOPERATIVE DIAGNOSIS:  Chronic pelvic abdominal pain with minimal  posterior cul-de-sac endometriosis.   PROCEDURES:  1. Diagnostic laparoscopy.  2. Cauterization of endometriosis.   SURGEON:  Malva Limes, MD   ANESTHESIA:  General endotracheal.   ANTIBIOTICS:  Ancef 1 g.   DRAINS:  Red rubber catheter to bladder.   ESTIMATED BLOOD LOSS:  Minimal.   COMPLICATIONS:  None.   FINDINGS:  The patient had normal-appearing liver, diaphragm, and  gallbladder.  She also had a normal-appearing appendix.  The part of the  bowel which could be visualized all appeared to be normal.  The anterior  cul-de-sac was normal.  The uterus was anteverted and normal.  Fallopian  tubes appeared to be normal.  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  In the posterior cul-de-sac,  there were several small implants of endometriosis mostly on the right  lower quadrant of the posterior cul-de-sac.  Ovaries were normal  bilaterally.  There was a follicular cyst on the left ovary  approximately 2 cm in size.   PROCEDURE:  The patient was taken to the operating room where she was  placed in dorsal supine position.  General endotracheal anesthetic was  administered without difficulty.  She was then placed in the dorsal  lithotomy position.  She was prepped with Betadine and draped in the  usual fashion for this procedure.  A Hulka tenaculum was applied to the  anterior cervical lip.  Her umbilicus was injected with 0.25% Marcaine  and a vertical skin incision was  made in the infraumbilical region.  The  fascia was grasped and entered with the Mayo scissors.  The parietal  peritoneum was entered sharply.  A Hasson cannula was placed into the  abdominal cavity and 3 liters of carbon dioxide was insufflated.  The  patient was then placed in Trendelenburg.  A 5-mm port was placed in the  suprapubic region under direct visualization.  The findings were as  noted above.  At this point, the Kleppinger forceps were placed in the  posterior cul-de-sac and the areas of endometriosis completely  cauterized.  At this point, the hydatid cyst of Morgagni in the right  fallopian tube were also cauterized.  There was no evidence of any other  disease and therefore the procedure was concluded.  The instruments were  removed.  The pneumoperitoneum released.  The fascia was closed with  interrupted 0 Vicryl suture and the skin with 3-0 Vicryl suture.  The  patient  was extubated and taken to recovery room in stable condition.  Instrument and lap counts were correct x1.  The patient  will be  discharged to home.  She will be instructed to follow up in the office  in 4 weeks.  She was sent home with Percocet to take p.r.n.           ______________________________  Malva Limes, M.D.     MA/MEDQ  D:  08/08/2008  T:  08/09/2008  Job:  [id number]

## 2010-11-03 NOTE — Discharge Summary (Signed)
Taylor [name], Taylor [name]               ACCOUNT NO.:  [id number]   MEDICAL RECORD NO.:  [id number]          PATIENT TYPE:  INP   LOCATION:  1443                         FACILITY:  [facility]   PHYSICIAN:  Madolyn Frieze. Jens Som, MD, FACCDATE OF BIRTH:  12-03-1980   DATE OF ADMISSION:  08/22/2008  DATE OF DISCHARGE:  08/23/2008                               DISCHARGE SUMMARY   PRIMARY CARDIOLOGIST:  Dr. Lewayne Bunting.   PRIMARY CARE PHYSICIAN:  Not listed.   PROCEDURES PERFORMED DURING HOSPITALIZATION:  CT scan of the head  without contrast, found to be negative.   FINAL DISCHARGE DIAGNOSES:  1. Syncope, likely neurally mediated.  2. History of migraines.  3. History of anxiety disorder.  4. Palpitations.   HOSPITAL COURSE:  This is a 30 year old female who had history of  syncopal episodes, once in high school and once in college.  She was in  her usual state of health until several weeks ago when she began to have  recurrent episodes of syncope.  The patient was also seen in the past at  [facility] in January of 2010 for these  symptoms.  She had carotid Dopplers which showed no blockages and echo  revealing normal LV function.  The patient was found to have some sinus  tachycardia associated with anxiety and placed on a beta blocker.  The  patient also was diagnosed with a panic disorder and per psychiatry per  H and P, but no details are available.  The patient was placed on anti-  anxiety medications and SSRIs.   The patient returned to [facility] after having an episode of  lightheadedness and syncope.  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  She said that she did not  remember passing out, however, she remembers everything happening around  her.  There was no seizure activity.  The patient was brought to [facility] Emergency Room where she was seen and examined by Dr. Gilman Schmidt.  The patient had  admission to rule out cardiac etiology for  episodes.  The patient's cardiac enzymes were found to be negative but  she did have elevations in her SGOT and SGPT with an SGOT of 91 and an  SGPT 228.  The patient did have some mild diffuse tenderness in her  abdomen but there was no other changes.  Her EKG revealed normal sinus  rhythm.   The patient had no further episodes of syncope after getting hydration  and increased salt intake during hospitalization.  The patient was seen  and examined by Dr. Olga Millers on day of discharge and found to be  stable.  Her blood pressure was 106/75, heart rate 74, O2 sat 100% on  room air.  The patient states that when she fell she hit her head,  therefore a CT scan of her head without contrast was completed which was  found to be negative for bleed hematoma.  The patient will follow up  with Dr. Lewayne Bunting in 2 weeks.   Dr. Jens Som did  review all of her medications and found that Celexa and  clonazepam can also increased LFTs along with causing diarrhea.  He  recommends repeating LFTs early next week through primary care  physician's office.  If they remained elevated, would have her be  followed by a GI physician.  In the interim, the patient has been  advised not to drive.  She will be seen by Dr. Lewayne Bunting in  approximately 2 weeks.   DISCHARGE LABS:  Sodium 140, potassium 3.5, chloride 114, CO2 - 19, BUN  6, creatinine 0.76, glucose 97, SGOT 91, SGPT 228.  EKG revealing normal  sinus rhythm with nonspecific T-wave abnormalities noted.   DISCHARGE MEDICATIONS:  1. Toprol XL 25 mg daily.  2. Celexa 10 mg twice a day.  3. Clonazepam 0.5 mg at 12:00 p.m.  4. Clonazepam 1.5 mg at bedtime.  5. Topamax 75 mg at bedtime.  6. Multivitamins 1 tablet daily.  7. Necon 1/35 one tablet daily.   ALLERGIES:  NO KNOWN DRUG ALLERGIES.   FOLLOWUP PLANS AND APPOINTMENTS:  1. The patient will follow up with Dr. Gilman Schmidt on September 06, 2008 at 10:15 a.m.  2. The patient will follow up with primary care physician next week      and have lipids and LFTs drawn at that time.  3. The patient has been advised not to drive for the next 2 weeks.  4. The patient has been advised to report any further syncopal      episodes to primary care physician.      Bettey Mare. Lyman Bishop, NP      Madolyn Frieze. Jens Som, MD, [facility]  Electronically Signed    KML/MEDQ  D:  08/23/2008  T:  08/23/2008  Job:  [id number]

## 2010-11-03 NOTE — Assessment & Plan Note (Signed)
[facility]                         ELECTROPHYSIOLOGY OFFICE NOTE   NAME:Convey, APRILLE SAWHNEY                      MRN:          [id number]  DATE:09/06/2008                            DOB:          12-14-1980    Ms. Kisamore returns today for followup.  I met her several weeks ago when  she presented with unexplained syncope and her history and symptoms were  most consistent with a neurally-mediated event.  The patient has a  history of recurrent syncope in the past.  She also has a history of  severe anxiety problems and depression.  She has a history of migraines  and palpitations with no arrhythmias found after wearing a cardiac  monitor.  The patient returns today for followup.  She has not been back  to work.  She is very anxious and concerned.  She has had no recurrent  syncopal episodes, though she has had an occasional dizzy spell.   CURRENT MEDICATIONS:  1. Toprol-XL 25 a day.  2. Celexa 10 a day.  3. Clonazepam 0.5 at noon and 1.5 at bedtime.  4. Topamax daily.  5. Multivitamin.  6. Necon tablet daily, dose unknown.   PHYSICAL EXAMINATION:  GENERAL:  She is a pleasant well-appearing young  lady in no acute distress.  VITAL SIGNS:  Her blood pressure today was 100/60, the pulse was 73 and  regular, respirations were 18.  NECK:  No jugular venous distention.  LUNGS:  Clear bilaterally to auscultation.  No wheezes, rales, or  rhonchi are present.  There is no increased work of breathing.  CARDIOVASCULAR:  Regular rate and rhythm.  Normal S1 and S2.  ABDOMEN:  Soft, nontender, nondistended.  EXTREMITIES:  No edema.   EKG demonstrates sinus rhythm with normal axis and intervals.   IMPRESSION:  1. Syncope, almost certainly neurally mediated.  2. Migraine headaches.  3. Severe anxiety and depression.  4. Palpitations.   DISCUSSION:  Today, we spent considerable amount of time with Ms. Sherrine Maples.  She is quite anxious in dealing with her anxiety  as it relates to her  syncope, is very difficult.  She also admits to some problems with  depression.  I have encouraged the patient to go back to work.  In  addition, I have asked that she try to wean herself off of her beta-  blockers, as she runs relatively hypotensive and I think the beta-  blockers are making things worse.  To this end, I have asked that she  break her Toprol in half and decrease it to half tablet daily for 10  days and then half tablet every other day for 10 more days before  stopping it altogether.  She could take it down the road as needed  p.r.n.  In addition, I have encouraged that she maintain a high-salt,  high-fluid diet, and hopefully with this combination, she will improve.  She has agreed to follow back up with Dr. Jeannine Kitten who is her psychiatrist  in [facility].     Doylene Canning. Ladona Ridgel, MD  Electronically Signed    GWT/MedQ  DD: 09/08/2008  DT: 09/09/2008  Job #: [id number]   cc:   Arlys John A. Jeannine Kitten, M.D.

## 2010-11-03 NOTE — H&P (Signed)
NAMELAWREN, SEXSON               ACCOUNT NO.:  [id number]   MEDICAL RECORD NO.:  [id number]          PATIENT TYPE:  INP   LOCATION:  1443                         FACILITY:  [facility]   PHYSICIAN:  Doylene Canning. Ladona Ridgel, MD    DATE OF BIRTH:  [birth year]   DATE OF ADMISSION:  08/22/2008  DATE OF DISCHARGE:                              HISTORY & PHYSICAL   ADMITTING DIAGNOSIS:  Unexplained syncope.   CHIEF COMPLAINT:  The patient's chief complaint is I passed out.   HISTORY OF PRESENT ILLNESS:  The patient is a 30 year old woman who  initially passed out in [employer].  She passed out to get in college.  She was in her usual state of health until several weeks ago when she  began to have recurrent episodes of syncope.  In January, she went to  the [facility] where she was admitted to the  hospital.  Her echo demonstrated normal LV function.  She had bilateral  carotid Dopplers which showed no blockages.  She had some sinus  tachycardia associated with anxiety and was placed on the beta blocker.  She typically has sinus rates in the 120 range and also diagnosed with  the panic disorder for a phobia at that time.  That was based on a  psychiatric consultation, I do not have the details.  The patient was  however placed on anti-anxiety meds and SSRIs, and she was discharged.  She was in her usual state of health until today.  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  Internally enough,  the patient is able to describe exactly what happened after that in  clear detail though she states that she was not awake and was told what  happened by others at the same.  She denies loss of bowel or bladder  continence.  She denies tongue biting.  There was no seizure-like  activity demonstrated or documented.  She was  brought to the hospital  for additional evaluation.   PAST MEDICAL HISTORY:  Notable for migraine headaches.  She has a  history of anxiety source as previously noted.  She has palpitations and  is on low-dose metoprolol.   FAMILY HISTORY:  Notable for mother who was diagnosed with seizure  disorder.  Father is alive and well.   SOCIAL HISTORY:  The patient is married.  She denies tobacco or ethanol  abuse.  She has no children.   REVIEW OF SYSTEMS:  Notable for fatigue, malaise, weakness, and feeling  of tiredness.  She also has problems with recurrent headaches, recently  received a steroid injection.  Additional past medical history is  notable for endometriosis for which she underwent a laser procedure, do  not have the results of this.  She had a recent removal of her wisdom  teeth.  She also has a history of iron deficiency anemia which has been  poorly controlled with iron  therapy.  Otherwise, her review of systems  was reviewed in detail.  All systems were negative except as noted  above.   PHYSICAL EXAMINATION:  GENERAL:  She is a pleasant young woman in no  distress.  VITAL SIGNS:  The initial blood pressure was 100/70, the pulse was 88  and regular, the respirations were 16, temperature was 98.  HEENT:  Normocephalic and atraumatic.  Pupils equal and round.  Oropharynx is moist.  Sclerae anicteric.  NECK:  No jugular venous distention.  There is no thyromegaly.  Trachea  is midline.  The carotids are 2+ and symmetric.  LUNGS:  Clear bilaterally to auscultation.  No wheezes, rales, or  rhonchi present.  There is no increased work of breathing.  CARDIOVASCULAR:  Regular rate and rhythm.  Normal S1 and S2.  There are  no murmurs, rubs, or gallops present.  ABDOMEN:  Soft, nontender.  There is no organomegaly.  EXTREMITIES:  No cyanosis, clubbing, or edema.  The pulses are 2+ and  symmetric.  NEUROLOGIC:  Alert and oriented x2.  Cranial nerves intact.  Strength is  5/5  and symmetric.   EKG demonstrates sinus rhythm with nonspecific T-wave abnormality.   IMPRESSION:  Recurrent episodes of syncope, is a likely nearly mediated  event.  The patient; however, remained despite IV fluids still dizzy and  lightheaded.  For this reason, we will plan on admit her to the hospital  for telemetry monitoring and orthostatic blood pressure monitoring.  We  will continue very vigorous hydration with normal saline.  We will  obtain labs in the morning and consider early discharge of the patient.  I have extensively counseled this patient and her family members over 45  minutes addressing the issues and the importance of maintaining  increased salt intake, increased fluid intake, and most importantly  lying down or sitting down when she feels an episode is coming on.  She  may ultimately require additional medical therapy for this, though for  now would like to continue on with conservative measures.  She is  discharged.  Tomorrow, we will plan on early followup and up titration  of her salt intake as able with consideration for additional medical  therapy.      Doylene Canning. Ladona Ridgel, MD  Electronically Signed     GWT/MEDQ  D:  08/22/2008  T:  08/23/2008  Job:  [id number]

## 2011-01-01 IMAGING — CT CT ABD-PELV W/ CM
2 of 4 series · 16 of 46 positions shown, 18 images · IV contrast (APPLIED)
Comparison: 07/22/2008

CLINICAL DATA: Severe abdominal pain.  Nausea.  Vaginal discharge.
Urinary tract infections and endometriosis.

CT ABDOMEN AND PELVIS WITH CONTRAST
TECHNIQUE: Multidetector CT imaging of the abdomen and pelvis was
performed following the standard protocol during bolus
administration of intravenous contrast.
Contrast: 100 ml 6mnipaque-544 and oral contrast

[Series 2: abd/pelvis 5.0 b31f · axial · 0.62mm/px · z∈[-441,-31]mm · 13 of 90 slices shown, 15 images]
[im 4/90  soft-tissue]
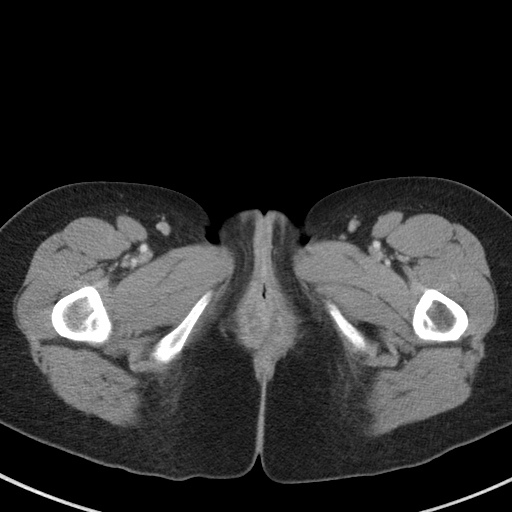
[im 4/90  bone]
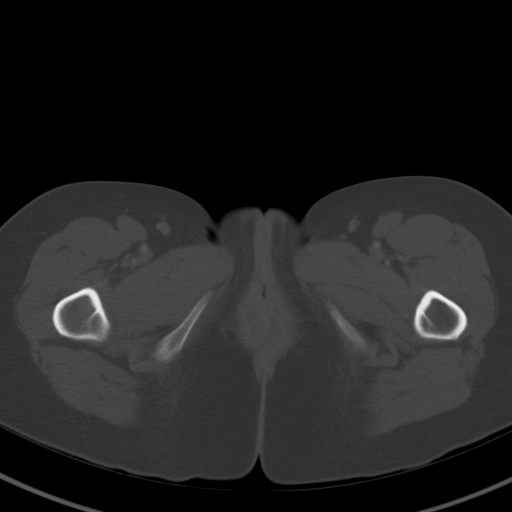
[im 12/90  soft-tissue]
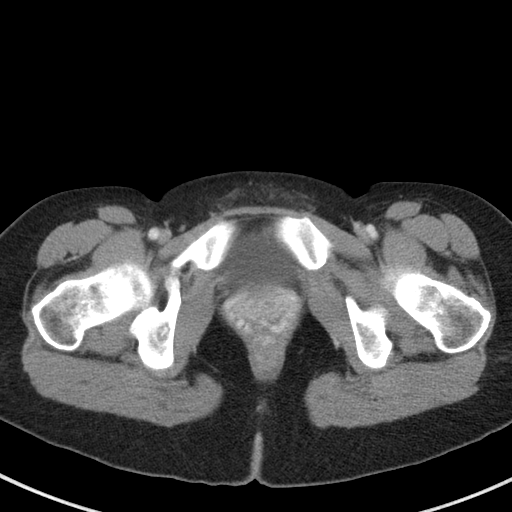
[im 19/90  soft-tissue]
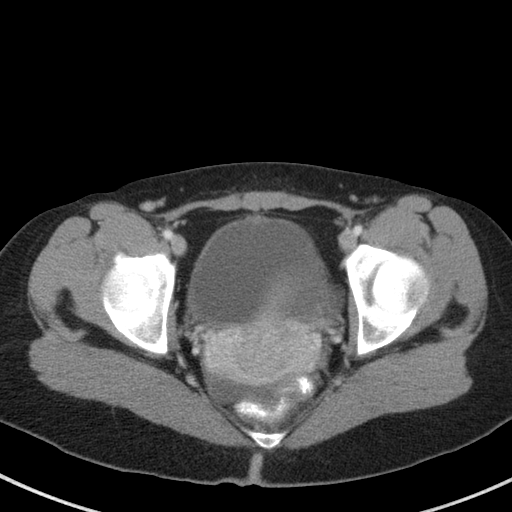
[im 26/90  soft-tissue]
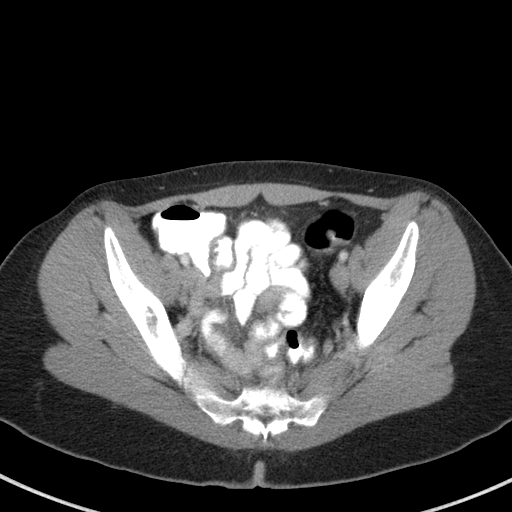
[im 30/90  soft-tissue]
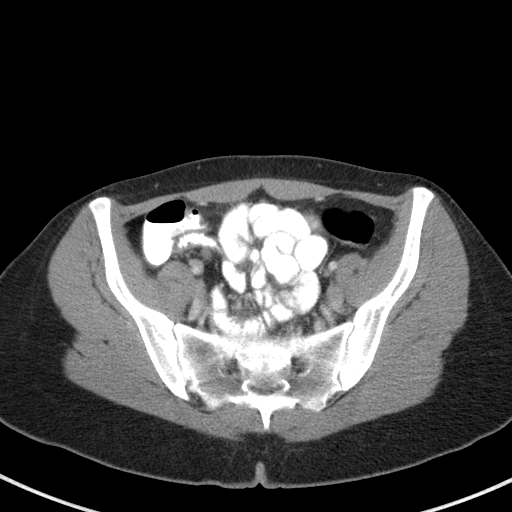
[im 38/90  soft-tissue]
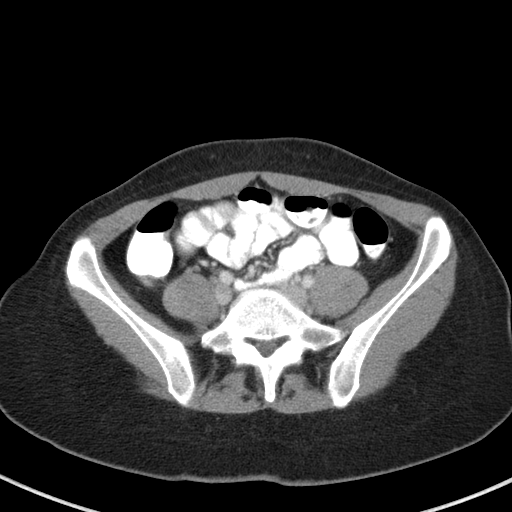
[im 45/90  soft-tissue]
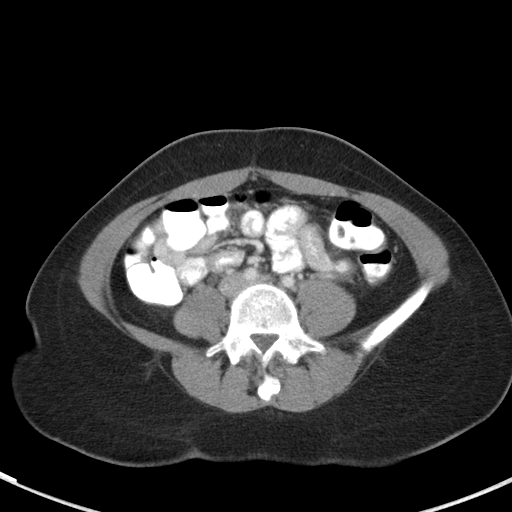
[im 52/90  soft-tissue]
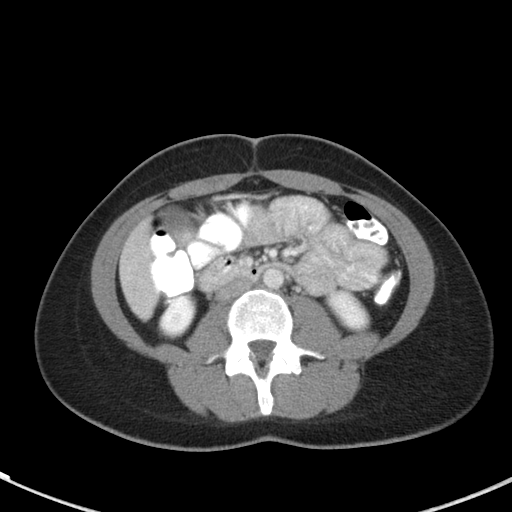
[im 60/90  soft-tissue]
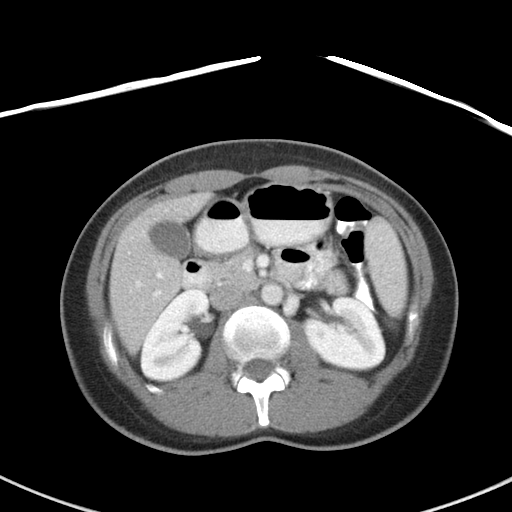
[im 60/90  bone]
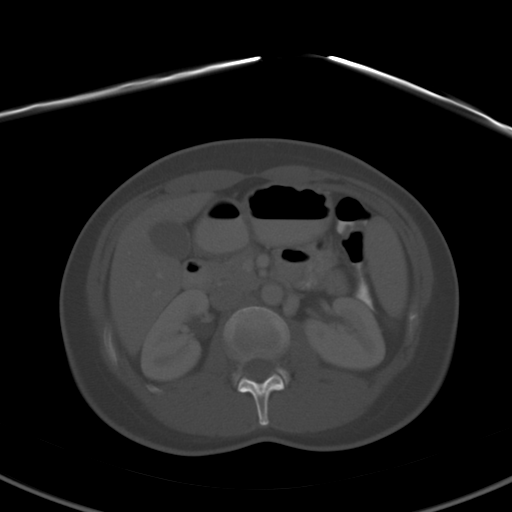
[im 64/90  soft-tissue]
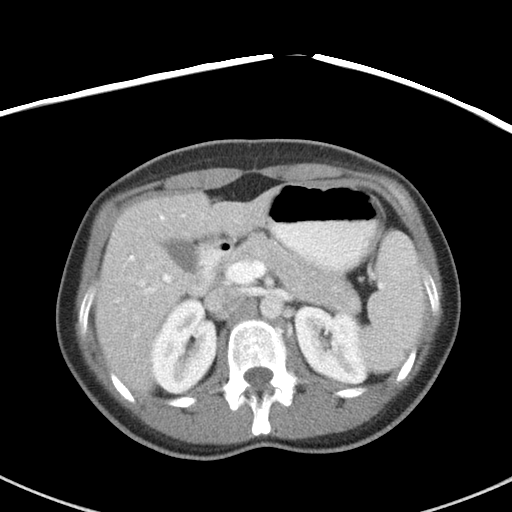
[im 71/90  soft-tissue]
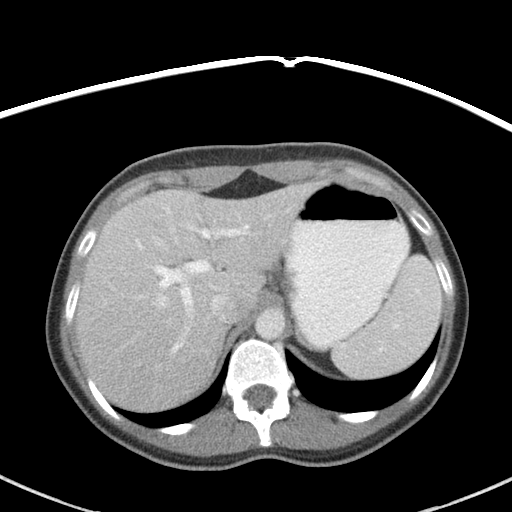
[im 78/90  soft-tissue]
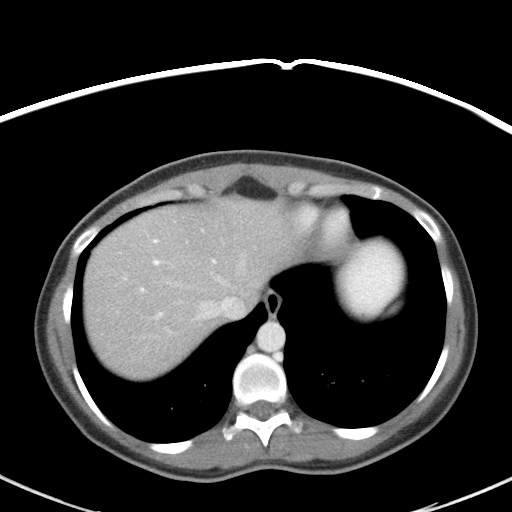
[im 86/90  soft-tissue]
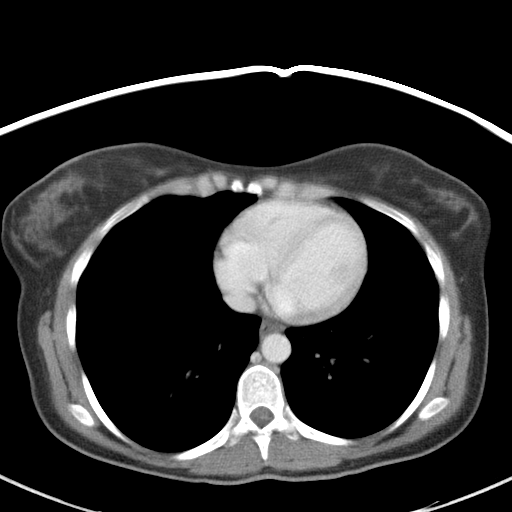

[Series 5: abd/pelvis 3.0 coronal · coronal · 0.62mm/px · 3 of 70 slices shown]
[im 24/70  soft-tissue]
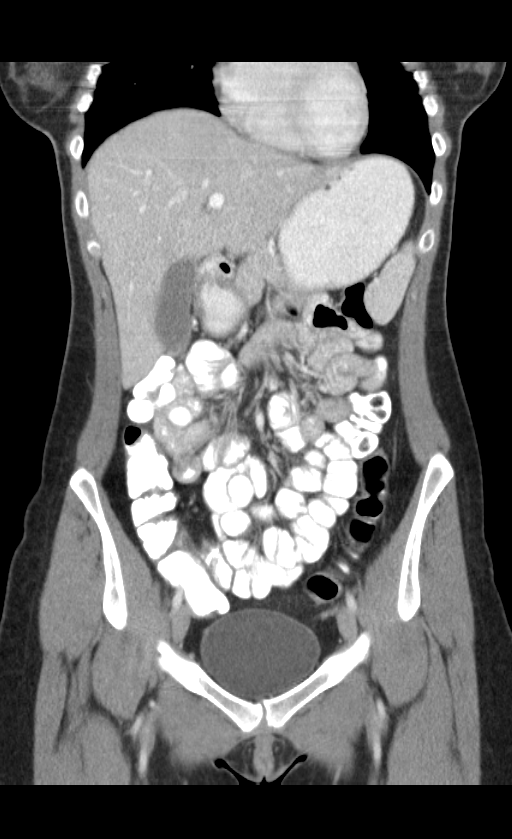
[im 31/70  soft-tissue]
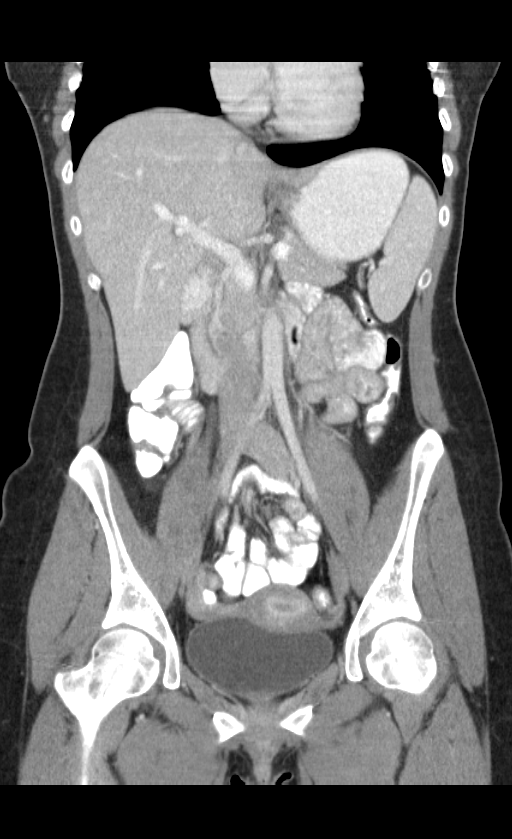
[im 39/70  soft-tissue]
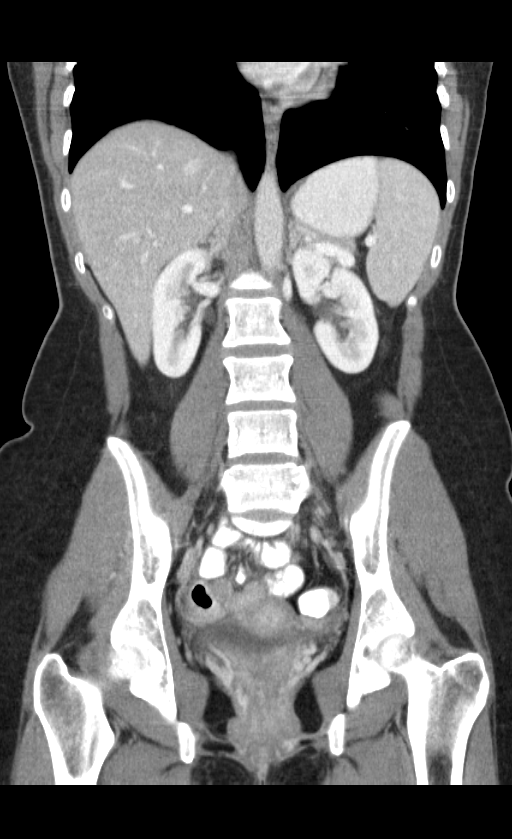

[16 of 46 positions shown; findings below may reference images not displayed]

FINDINGS: The abdominal parenchymal organs are normal in
appearance.  Gallbladder is unremarkable.  No evidence of
hydronephrosis.

No soft tissue masses are identified within the abdomen or pelvis.
No evidence of lymphadenopathy.  No evidence of inflammatory
process or abscess.  Uterus and adnexa are unremarkable.  A small
amount of free fluid noted in the pelvic cul-de-sac.
IMPRESSION: 1.  Small amount of free fluid in the pelvic cul-de-sac, which is
nonspecific but likely physiologic.
2.  Otherwise unremarkable exam.

## 2011-04-19 ENCOUNTER — Emergency Department (HOSPITAL_BASED_OUTPATIENT_CLINIC_OR_DEPARTMENT_OTHER)
Admission: EM | Admit: 2011-04-19 | Discharge: 2011-04-19 | Disposition: A | Discharge status: Home / Self Care | Payer: BC Managed Care – PPO | Attending: Emergency Medicine | Admitting: Emergency Medicine

## 2011-04-19 DIAGNOSIS — F341 Dysthymic disorder: Secondary | ICD-10-CM | POA: Insufficient documentation

## 2011-04-19 DIAGNOSIS — F410 Panic disorder [episodic paroxysmal anxiety] without agoraphobia: Secondary | ICD-10-CM | POA: Insufficient documentation

## 2011-04-19 DIAGNOSIS — F411 Generalized anxiety disorder: Secondary | ICD-10-CM | POA: Insufficient documentation

## 2011-04-19 DIAGNOSIS — F419 Anxiety disorder, unspecified: Secondary | ICD-10-CM

## 2011-04-19 HISTORY — DX: Interstitial cystitis (chronic) without hematuria: N30.10

## 2011-04-19 HISTORY — DX: Syncope and collapse: R55

## 2011-04-19 HISTORY — DX: Depression, unspecified: F32.A

## 2011-04-19 HISTORY — DX: Anxiety disorder, unspecified: F41.9

## 2011-04-19 HISTORY — DX: Major depressive disorder, single episode, unspecified: F32.9

## 2011-04-19 HISTORY — DX: Tachycardia, unspecified: R00.0

## 2011-04-19 MED ORDER — HYDROMORPHONE HCL 1 MG/ML IJ SOLN
1.0000 mg | Freq: Once | INTRAMUSCULAR | Status: AC
  Administered 2011-04-19: 1 mg via INTRAMUSCULAR
  Filled 2011-04-19: qty 1

## 2011-04-19 MED ORDER — LORAZEPAM 1 MG PO TABS
2.0000 mg | ORAL_TABLET | Freq: Once | ORAL | Status: AC
  Administered 2011-04-19: 2 mg via ORAL
  Filled 2011-04-19: qty 2

## 2011-04-19 NOTE — ED Notes (Signed)
While Dr Dierdre Highman was at the bedside pt requested Dilantin for her panic attack this morning.  Pt states that this is what she was given up her last visit to the ED for a panic attack.  Pt was informed by Dr Dierdre Highman that Dilantin is used to treat seizure disorders and patient was advised to take 2mg  of Ativan.  Medication was administered, husband voiced understanding and agreement to this treatment regimen and encouraged the patient to take the Ativan as prescribed.

## 2011-04-19 NOTE — ED Provider Notes (Signed)
History     CSN: [id number] Arrival date & time: 04/19/2011  6:28 AM   None     Chief Complaint  Patient presents with  . Panic Attack    (Consider location/radiation/quality/duration/timing/severity/associated sxs/prior treatment) Patient is a 30 y.o. female presenting with anxiety.  Anxiety This is a recurrent problem. The current episode started 6 to 12 hours ago. The problem occurs constantly. The problem has not changed since onset.Pertinent negatives include no abdominal pain and no headaches. The symptoms are aggravated by nothing. The symptoms are relieved by nothing. Treatments tried: Xanax at home. The treatment provided no relief.   Per my husband has been crying all night. Said she's stressed out at work and is unable to stop crying. States she has not had symptoms like this over year but does have a history of same. She is followed by psychiatrist Dr. Jeannine Kitten for similar symptoms. He does drink alcohol last night she feels that this precipitated her symptoms today. No SI or HI. No psychosis or hallucinations. Moderate in severity.  Past Medical History  Diagnosis Date  . Anxiety   . Depression   . IC (interstitial cystitis)   . Syncope   . Tachycardia     Past Surgical History  Procedure Date  . Colonoscopy     History reviewed. No pertinent family history.  History  Substance Use Topics  . Smoking status: Never Smoker   . Smokeless tobacco: Never Used  . Alcohol Use: Yes     pt had 3 beers last night    OB History    Grav Para Term Preterm Abortions TAB SAB Ect Mult Living                  Review of Systems  Constitutional: Negative for fever and chills.  HENT: Negative for trouble swallowing, neck pain and neck stiffness.   Eyes: Negative for pain.  Respiratory: Negative for cough and wheezing.   Cardiovascular: Positive for palpitations. Negative for leg swelling.  Gastrointestinal: Negative for abdominal pain.  Genitourinary: Negative for  dysuria.  Musculoskeletal: Negative for back pain.  Skin: Negative for rash.  Neurological: Negative for headaches.  Psychiatric/Behavioral: Negative for self-injury. The patient is nervous/anxious.   All other systems reviewed and are negative.    Allergies  Review of patient's allergies indicates no known allergies.  Home Medications  No current outpatient prescriptions on file.  There were no vitals taken for this visit.  Physical Exam  Constitutional: She is oriented to person, place, and time. She appears well-developed and well-nourished.  HENT:  Head: Normocephalic and atraumatic.  Eyes: Conjunctivae and EOM are normal. Pupils are equal, round, and reactive to light.  Neck: Full passive range of motion without pain. Neck supple. No thyromegaly present.       No meningismus  Cardiovascular: Normal rate, regular rhythm, S1 normal, S2 normal and intact distal pulses.   Pulmonary/Chest: Effort normal and breath sounds normal.  Abdominal: Soft. Bowel sounds are normal. There is no tenderness. There is no CVA tenderness.  Musculoskeletal: Normal range of motion.  Neurological: She is alert and oriented to person, place, and time. She has normal strength and normal reflexes. No cranial nerve deficit or sensory deficit. She displays a negative Romberg sign. GCS eye subscore is 4. GCS verbal subscore is 5. GCS motor subscore is 6.       Normal Gait  Skin: Skin is warm and dry. No rash noted. No cyanosis. Nails show no clubbing.  Psychiatric: Her speech is normal. Her mood appears anxious.       Crying and tearful exam    ED Course  Procedures (including critical care time)  PT also c/o pain with h/o IC and requesting pain medication for the same. IM dilaudid provided. No F/C or back pain. No N/V.    MDM   Anxiety and h/o chronic pain and IC - I suspect exacerbation of these symptoms. Ativan and pain medications provided. PT has close f/u with PCP and also her Psychiatrist DR  Jeannine Kitten. She has xanax at home and clinically improved in the ED. Clinically, no indication for further work up at this time. Stable for discharge home.           Sunnie Nielsen, MD 04/19/11 [phone]

## 2011-04-19 NOTE — ED Notes (Signed)
1L [place] applied for O2 administration for placebo effect.  Pt states that she feels short of breath.  100% on RA at this time.  Pt actively crying

## 2011-04-19 NOTE — ED Notes (Signed)
Pt presents this morning crying.  Pt's husband states that patient became restless and agitated last night and that she was out drinking last night but only had "a few drinks".  Pt's husband states that patient has a hx of the same, but has been under control for the past year.  Pt is under a lot of stress at work.

## 2011-04-19 NOTE — ED Notes (Addendum)
Pt is now feeling better at this time, crying has resolved.  Dilaudid administered for pt's headache per Dr Dierdre Highman.

## 2011-08-02 ENCOUNTER — Emergency Department (HOSPITAL_BASED_OUTPATIENT_CLINIC_OR_DEPARTMENT_OTHER)
Admission: EM | Admit: 2011-08-02 | Discharge: 2011-08-02 | Disposition: A | Discharge status: Home / Self Care | Payer: BC Managed Care – PPO | Attending: Emergency Medicine | Admitting: Emergency Medicine

## 2011-08-02 ENCOUNTER — Encounter (HOSPITAL_BASED_OUTPATIENT_CLINIC_OR_DEPARTMENT_OTHER): Payer: Self-pay | Admitting: *Deleted

## 2011-08-02 DIAGNOSIS — R112 Nausea with vomiting, unspecified: Secondary | ICD-10-CM

## 2011-08-02 DIAGNOSIS — F341 Dysthymic disorder: Secondary | ICD-10-CM | POA: Insufficient documentation

## 2011-08-02 DIAGNOSIS — R109 Unspecified abdominal pain: Secondary | ICD-10-CM | POA: Insufficient documentation

## 2011-08-02 DIAGNOSIS — R197 Diarrhea, unspecified: Secondary | ICD-10-CM | POA: Insufficient documentation

## 2011-08-02 LAB — URINE MICROSCOPIC-ADD ON

## 2011-08-02 LAB — URINALYSIS, ROUTINE W REFLEX MICROSCOPIC
Bilirubin Urine: NEGATIVE
Hgb urine dipstick: NEGATIVE
Specific Gravity, Urine: 1.026 (ref 1.005–1.030)
pH: 7.5 (ref 5.0–8.0)

## 2011-08-02 MED ORDER — ONDANSETRON HCL 4 MG/2ML IJ SOLN
4.0000 mg | Freq: Once | INTRAMUSCULAR | Status: AC
  Administered 2011-08-02: 4 mg via INTRAVENOUS
  Filled 2011-08-02: qty 2

## 2011-08-02 MED ORDER — ONDANSETRON HCL 4 MG/2ML IJ SOLN
INTRAMUSCULAR | Status: AC
  Filled 2011-08-02: qty 2

## 2011-08-02 MED ORDER — SODIUM CHLORIDE 0.9 % IV BOLUS (SEPSIS)
1000.0000 mL | Freq: Once | INTRAVENOUS | Status: AC
  Administered 2011-08-02: 1000 mL via INTRAVENOUS

## 2011-08-02 MED ORDER — ONDANSETRON HCL 4 MG/2ML IJ SOLN
4.0000 mg | Freq: Once | INTRAMUSCULAR | Status: AC
  Administered 2011-08-02: 4 mg via INTRAVENOUS

## 2011-08-02 MED ORDER — ONDANSETRON HCL 4 MG PO TABS: 4.0000 mg | ORAL_TABLET | Freq: Four times a day (QID) | ORAL | Status: AC

## 2011-08-02 NOTE — ED Provider Notes (Signed)
History     CSN: [id number]  Arrival date & time 08/02/11  1191   First MD Initiated Contact with Patient 08/02/11 0415      Chief Complaint  Patient presents with  . Nausea  . Emesis  . Diarrhea  . Abdominal Cramping    (Consider location/radiation/quality/duration/timing/severity/associated sxs/prior treatment) Patient is a 31 y.o. female presenting with vomiting, diarrhea, and cramps. The history is provided by the patient.  Emesis  This is a new problem. The current episode started 6 to 12 hours ago. The problem occurs 5 to 10 times per day. The problem has not changed since onset.The emesis has an appearance of stomach contents. There has been no fever. The fever has been present for less than 1 day. Associated symptoms include diarrhea. Pertinent negatives include no abdominal pain, no chills, no fever, no headaches and no URI. Risk factors: Is [redacted] weeks pregnant. No known sick contacts or recent travel.  Diarrhea The primary symptoms include nausea, vomiting and diarrhea. Primary symptoms do not include fever, abdominal pain, dysuria or rash.  The illness does not include chills or back pain.  Abdominal Cramping The primary symptoms of the illness include nausea, vomiting and diarrhea. The primary symptoms of the illness do not include abdominal pain, fever, shortness of breath or dysuria.  Symptoms associated with the illness do not include chills or back pain.   Started with nausea and vomiting and then developed diarrhea. Has had multiple episodes of emesis and last time she threw up noticed a few specks of bright red blood. No pain or radiation.  She has not had any problems with vomiting in pregnancy. G1 P0. Has had her first ultrasound with her OB/GYN and was told she has early pregnancy. No vaginal bleeding. No pelvic pain.  Past Medical History  Diagnosis Date  . Anxiety   . Depression   . IC (interstitial cystitis)   . Syncope   . Tachycardia     Past Surgical  History  Procedure Date  . Colonoscopy     History reviewed. No pertinent family history.  History  Substance Use Topics  . Smoking status: Never Smoker   . Smokeless tobacco: Never Used  . Alcohol Use: Yes     pt had 3 beers last night    OB History    Grav Para Term Preterm Abortions TAB SAB Ect Mult Living   1               Review of Systems  Constitutional: Negative for fever and chills.  HENT: Negative for neck pain and neck stiffness.   Eyes: Negative for pain.  Respiratory: Negative for shortness of breath.   Cardiovascular: Negative for chest pain.  Gastrointestinal: Positive for nausea, vomiting and diarrhea. Negative for abdominal pain.  Genitourinary: Negative for dysuria.  Musculoskeletal: Negative for back pain.  Skin: Negative for rash.  Neurological: Negative for headaches.  All other systems reviewed and are negative.    Allergies  Review of patient's allergies indicates no known allergies.  Home Medications   Current Outpatient Rx  Name Route Sig Dispense Refill  . AMPHETAMINE-DEXTROAMPHETAMINE 10 MG PO TABS Oral Take 10 mg by mouth daily.    Marland Kitchen ALPRAZOLAM 1 MG PO TABS Oral Take 1 mg by mouth at bedtime.      . CYMBALTA PO Oral Take 25 mg by mouth daily.        BP 99/74  Pulse 105  Temp(Src) 97.5 F (36.4 C) (Oral)  Resp 16  Physical Exam  Constitutional: She is oriented to person, place, and time. She appears well-developed and well-nourished.  HENT:  Head: Normocephalic and atraumatic.  Mouth/Throat: No oropharyngeal exudate.       Dry mucous membranes  Eyes: Conjunctivae and EOM are normal. Pupils are equal, round, and reactive to light. No scleral icterus.  Neck: Trachea normal. Neck supple. No thyromegaly present.  Cardiovascular: Normal rate, regular rhythm, S1 normal, S2 normal and normal pulses.     No systolic murmur is present   No diastolic murmur is present  Pulses:      Radial pulses are 2+ on the right side, and 2+ on  the left side.  Pulmonary/Chest: Effort normal and breath sounds normal. She has no wheezes. She has no rhonchi. She has no rales. She exhibits no tenderness.  Abdominal: Soft. Normal appearance. She exhibits no mass. There is no tenderness. There is no rebound, no guarding, no CVA tenderness and negative Murphy's sign.       Hyperactive bowel sounds.   Musculoskeletal: She exhibits no edema.  Neurological: She is alert and oriented to person, place, and time. She has normal strength. No cranial nerve deficit or sensory deficit. GCS eye subscore is 4. GCS verbal subscore is 5. GCS motor subscore is 6.  Skin: Skin is warm and dry. No rash noted. She is not diaphoretic.  Psychiatric: Her speech is normal.       Cooperative and appropriate    ED Course  Procedures (including critical care time)  Labs Reviewed  URINALYSIS, ROUTINE W REFLEX MICROSCOPIC - Abnormal; Notable for the following:    APPearance CLOUDY (*)    Ketones, ur 15 (*)    Leukocytes, UA SMALL (*)    All other components within normal limits  URINE MICROSCOPIC-ADD ON - Abnormal; Notable for the following:    Squamous Epithelial / LPF MANY (*)    Bacteria, UA MANY (*)    All other components within normal limits   IV fluids and Zofran. Serial abdominal exams with no peritonitis or tenderness.  UA sent at triage- contaminated sample. No UTI symptoms. Culture sent.  Check at 6:30 AM is feeling better. No emesis in the ED. No abdominal pain. Exam unchanged. MDM   Nausea vomiting and diarrhea in the setting of first trimester pregnancy. IV fluid hydration as above. Stable for discharge home and close followup with OB/GYN. Reliable historian verbalizes understanding dehydration and abdominal pain precautions. Prescription for Zofran provided and plan clear liquids for the next 24 hours and then slowly advance diet as tolerated.        Sunnie Nielsen, MD 08/02/11 [phone]

## 2011-08-02 NOTE — ED Notes (Signed)
Pt with sudden onset of abd cramping N/V/D pt also reports weakness states that she has vomited x 4 and had 2 episodes of diarrhea since last Pm around 2300. Pt is [redacted] weeks pregnant

## 2011-08-04 LAB — URINE CULTURE

## 2011-08-05 NOTE — ED Notes (Signed)
+   Urine Chart sent to EDP office for review. 

## 2011-08-06 NOTE — ED Notes (Signed)
Spoke w/ pt; she asked if we could fax results to [facility] b/c pregnant

## 2012-02-29 ENCOUNTER — Encounter (HOSPITAL_COMMUNITY): Payer: Self-pay | Admitting: *Deleted

## 2012-02-29 ENCOUNTER — Inpatient Hospital Stay (HOSPITAL_COMMUNITY)
Admission: AD | Admit: 2012-02-29 | Discharge: 2012-03-04 | DRG: 651 | Disposition: A | Discharge status: Home / Self Care | Payer: BC Managed Care – PPO | Source: Ambulatory Visit | Attending: Obstetrics and Gynecology | Admitting: Obstetrics and Gynecology

## 2012-02-29 DIAGNOSIS — J020 Streptococcal pharyngitis: Secondary | ICD-10-CM

## 2012-02-29 DIAGNOSIS — D649 Anemia, unspecified: Secondary | ICD-10-CM

## 2012-02-29 DIAGNOSIS — IMO0002 Reserved for concepts with insufficient information to code with codable children: Principal | ICD-10-CM | POA: Diagnosis present

## 2012-02-29 DIAGNOSIS — R002 Palpitations: Secondary | ICD-10-CM

## 2012-02-29 DIAGNOSIS — S139XXA Sprain of joints and ligaments of unspecified parts of neck, initial encounter: Secondary | ICD-10-CM

## 2012-02-29 DIAGNOSIS — R51 Headache: Secondary | ICD-10-CM

## 2012-02-29 DIAGNOSIS — L121 Cicatricial pemphigoid: Secondary | ICD-10-CM | POA: Diagnosis present

## 2012-02-29 DIAGNOSIS — M549 Dorsalgia, unspecified: Secondary | ICD-10-CM

## 2012-02-29 DIAGNOSIS — L509 Urticaria, unspecified: Secondary | ICD-10-CM

## 2012-02-29 DIAGNOSIS — F411 Generalized anxiety disorder: Secondary | ICD-10-CM

## 2012-02-29 DIAGNOSIS — R7309 Other abnormal glucose: Secondary | ICD-10-CM

## 2012-02-29 DIAGNOSIS — O324XX0 Maternal care for high head at term, not applicable or unspecified: Secondary | ICD-10-CM | POA: Diagnosis present

## 2012-02-29 DIAGNOSIS — R55 Syncope and collapse: Secondary | ICD-10-CM

## 2012-02-29 LAB — COMPREHENSIVE METABOLIC PANEL
ALT: 14 U/L (ref 0–35)
AST: 18 U/L (ref 0–37)
Alkaline Phosphatase: 119 U/L — ABNORMAL HIGH (ref 39–117)
CO2: 24 mEq/L (ref 19–32)
Chloride: 101 mEq/L (ref 96–112)
Creatinine, Ser: 0.76 mg/dL (ref 0.50–1.10)
GFR calc non Af Amer: >90 mL/min (ref >90)
Total Bilirubin: 0.2 mg/dL — ABNORMAL LOW (ref 0.3–1.2)

## 2012-02-29 LAB — URINALYSIS, ROUTINE W REFLEX MICROSCOPIC
Glucose, UA: 250 mg/dL — AB
Hgb urine dipstick: NEGATIVE
Specific Gravity, Urine: 1.02 (ref 1.005–1.030)
Urobilinogen, UA: 0.2 mg/dL (ref 0.0–1.0)

## 2012-02-29 LAB — CBC
HCT: 37.7 % (ref 36.0–46.0)
HCT: 38.4 % (ref 36.0–46.0)
MCH: 28.9 pg (ref 26.0–34.0)
MCV: 87.3 fL (ref 78.0–100.0)
MCV: 88.1 fL (ref 78.0–100.0)
Platelets: 233 10*3/uL (ref 150–400)
Platelets: 224 10*3/uL (ref 150–400)
RBC: 4.32 MIL/uL (ref 3.87–5.11)
RBC: 4.36 MIL/uL (ref 3.87–5.11)
WBC: 14.1 10*3/uL — ABNORMAL HIGH (ref 4.0–10.5)
WBC: 15.4 10*3/uL — ABNORMAL HIGH (ref 4.0–10.5)

## 2012-02-29 LAB — OB RESULTS CONSOLE GC/CHLAMYDIA
Chlamydia: NEGATIVE
Gonorrhea: NEGATIVE

## 2012-02-29 LAB — OB RESULTS CONSOLE RPR: RPR: NONREACTIVE

## 2012-02-29 LAB — URINE MICROSCOPIC-ADD ON

## 2012-02-29 LAB — OB RESULTS CONSOLE HIV ANTIBODY (ROUTINE TESTING): HIV: NONREACTIVE

## 2012-02-29 LAB — OB RESULTS CONSOLE RUBELLA ANTIBODY, IGM: Rubella: IMMUNE

## 2012-02-29 MED ORDER — IBUPROFEN 600 MG PO TABS: 600.0000 mg | ORAL_TABLET | Freq: Four times a day (QID) | ORAL | Status: DC | PRN

## 2012-02-29 MED ORDER — MISOPROSTOL 25 MCG QUARTER TABLET
25.0000 ug | ORAL_TABLET | ORAL | Status: DC | PRN
  Administered 2012-02-29 – 2012-03-01 (×3): 25 ug via VAGINAL
  Filled 2012-02-29 (×3): qty 0.25

## 2012-02-29 MED ORDER — ACETAMINOPHEN 325 MG PO TABS: 650.0000 mg | ORAL_TABLET | ORAL | Status: DC | PRN

## 2012-02-29 MED ORDER — FLEET ENEMA 7-19 GM/118ML RE ENEM: 1.0000 | ENEMA | RECTAL | Status: DC | PRN

## 2012-02-29 MED ORDER — LIDOCAINE HCL (PF) 1 % IJ SOLN
30.0000 mL | INTRAMUSCULAR | Status: DC | PRN
  Filled 2012-02-29: qty 30

## 2012-02-29 MED ORDER — LACTATED RINGERS IV SOLN
500.0000 mL | INTRAVENOUS | Status: DC | PRN
  Administered 2012-03-01: 500 mL via INTRAVENOUS

## 2012-02-29 MED ORDER — OXYTOCIN BOLUS FROM INFUSION
500.0000 mL | Freq: Once | INTRAVENOUS | Status: DC
  Filled 2012-02-29: qty 500

## 2012-02-29 MED ORDER — TERBUTALINE SULFATE 1 MG/ML IJ SOLN: 0.2500 mg | Freq: Once | INTRAMUSCULAR | Status: AC | PRN

## 2012-02-29 MED ORDER — OXYTOCIN 40 UNITS IN LACTATED RINGERS INFUSION - SIMPLE MED
62.5000 mL/h | Freq: Once | INTRAVENOUS | Status: DC
  Filled 2012-02-29: qty 1000

## 2012-02-29 MED ORDER — OXYCODONE-ACETAMINOPHEN 5-325 MG PO TABS: 1.0000 | ORAL_TABLET | ORAL | Status: DC | PRN

## 2012-02-29 MED ORDER — ONDANSETRON HCL 4 MG/2ML IJ SOLN: 4.0000 mg | Freq: Four times a day (QID) | INTRAMUSCULAR | Status: DC | PRN

## 2012-02-29 MED ORDER — CITRIC ACID-SODIUM CITRATE 334-500 MG/5ML PO SOLN
30.0000 mL | ORAL | Status: DC | PRN
  Administered 2012-03-01: 30 mL via ORAL
  Filled 2012-02-29: qty 15

## 2012-02-29 MED ORDER — LACTATED RINGERS IV SOLN
INTRAVENOUS | Status: DC
  Administered 2012-02-29 – 2012-03-01 (×3): via INTRAVENOUS

## 2012-02-29 NOTE — MAU Provider Note (Signed)
31 y.o. G1P0 at [redacted] weeks EGA sent from office for PIH eval. Pt denies headache, vision changes, abdominal pains. + fetal movement, irreg contractions. Medical screening exam completed - EFM reactive, BPs 140s/90s, DTRs WNL, abd soft and nontender. RN to report results to MD for plan of care.

## 2012-02-29 NOTE — MAU Note (Signed)
Pt sent over from physicians office for PIH evaluation 

## 2012-02-29 NOTE — H&P (Addendum)
31 y.o. G1P0000 comes in c/o elevated BP at home and dizzyness.  Pt had elevated mild range BP about 1 week ago, trace protein was noted at that visit.  She followed up several days later for repeat BP check which was normal however urine dip showed 1+ protein.  A 24hr urine protein was collected and found to be 304mg /24hr.  LFTs and Plts were normal.  In MAU BPs were found to be persistently 150s of 100-110s.  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  Otherwise has good fetal movement and no bleeding. Denis herpetic outbreak.  Past Medical History  Diagnosis Date  . Anxiety   . Depression   . IC (interstitial cystitis)   . Syncope   . Tachycardia     Past Surgical History  Procedure Date  . Colonoscopy     OB History    Grav Para Term Preterm Abortions TAB SAB Ect Mult Living   1 0 0 0 0 0 0 0 0 0      # Outc Date GA Lbr Len/2nd Wgt Sex Del Anes PTL Lv   1 CUR               History   Social History  . Marital Status: Married    Spouse Name: N/A    Number of Children: N/A  . Years of Education: N/A   Occupational History  . Not on file.   Social History Main Topics  . Smoking status: Never Smoker   . Smokeless tobacco: Never Used  . Alcohol Use: Yes     pt had 3 beers last night  . Drug Use: No  . Sexually Active: Yes    Birth Control/ Protection: None   Other Topics Concern  . Not on file   Social History Narrative  . No narrative on file   Review of patient's allergies indicates no known allergies.   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  GBS Neg  Izabella Marcantel

## 2012-02-29 NOTE — MAU Note (Signed)
Dr. Claiborne Billings returned call to MAU, notified that pt is here for [facility] eval, not to be directly admitted.

## 2012-03-01 ENCOUNTER — Encounter (HOSPITAL_COMMUNITY): Payer: Self-pay | Admitting: Obstetrics

## 2012-03-01 ENCOUNTER — Inpatient Hospital Stay (HOSPITAL_COMMUNITY): Payer: BC Managed Care – PPO | Admitting: Anesthesiology

## 2012-03-01 ENCOUNTER — Encounter (HOSPITAL_COMMUNITY): Payer: Self-pay | Admitting: Anesthesiology

## 2012-03-01 ENCOUNTER — Encounter (HOSPITAL_COMMUNITY)
Admission: AD | Disposition: A | Discharge status: Home / Self Care | Payer: Self-pay | Source: Ambulatory Visit | Attending: Obstetrics and Gynecology

## 2012-03-01 LAB — CBC
HCT: 38.6 % (ref 36.0–46.0)
Hemoglobin: 12.1 g/dL (ref 12.0–15.0)
MCH: 27.8 pg (ref 26.0–34.0)
MCHC: 31.3 g/dL (ref 30.0–36.0)
MCHC: 33.5 g/dL (ref 30.0–36.0)
MCV: 88.7 fL (ref 78.0–100.0)
Platelets: 181 10*3/uL (ref 150–400)
RDW: 17 % — ABNORMAL HIGH (ref 11.5–15.5)

## 2012-03-01 LAB — COMPREHENSIVE METABOLIC PANEL
BUN: 8 mg/dL (ref 6–23)
CO2: 23 mEq/L (ref 19–32)
Calcium: 8.7 mg/dL (ref 8.4–10.5)
GFR calc Af Amer: >90 mL/min (ref >90)
GFR calc non Af Amer: >90 mL/min (ref >90)
Glucose, Bld: 149 mg/dL — ABNORMAL HIGH (ref 70–99)
Total Protein: 5.6 g/dL — ABNORMAL LOW (ref 6.0–8.3)

## 2012-03-01 LAB — TYPE AND SCREEN: Antibody Screen: NEGATIVE

## 2012-03-01 SURGERY — Surgical Case
Anesthesia: Epidural | Site: Abdomen | Wound class: Clean Contaminated

## 2012-03-01 MED ORDER — PREDNISONE 5 MG PO TABS: 5.0000 mg | ORAL_TABLET | Freq: Every day | ORAL | Status: DC

## 2012-03-01 MED ORDER — CEFAZOLIN SODIUM-DEXTROSE 2-3 GM-% IV SOLR
INTRAVENOUS | Status: AC
  Filled 2012-03-01: qty 50

## 2012-03-01 MED ORDER — DIAZEPAM 10 MG/2ML IM DEVI
INTRAMUSCULAR | Status: DC | PRN
  Administered 2012-03-01 (×3): 2.5 mg via BUCCAL

## 2012-03-01 MED ORDER — PHENYLEPHRINE 40 MCG/ML (10ML) SYRINGE FOR IV PUSH (FOR BLOOD PRESSURE SUPPORT)
PREFILLED_SYRINGE | INTRAVENOUS | Status: AC
  Filled 2012-03-01: qty 5

## 2012-03-01 MED ORDER — FENTANYL CITRATE 0.05 MG/ML IJ SOLN
INTRAMUSCULAR | Status: DC | PRN
  Administered 2012-03-01 (×2): 50 ug via INTRAVENOUS

## 2012-03-01 MED ORDER — OXYTOCIN 10 UNIT/ML IJ SOLN
INTRAMUSCULAR | Status: AC
  Filled 2012-03-01: qty 4

## 2012-03-01 MED ORDER — CEFAZOLIN SODIUM-DEXTROSE 2-3 GM-% IV SOLR
INTRAVENOUS | Status: DC | PRN
  Administered 2012-03-01: 2 g via INTRAVENOUS

## 2012-03-01 MED ORDER — CYCLOBENZAPRINE HCL 5 MG PO TABS
5.0000 mg | ORAL_TABLET | Freq: Three times a day (TID) | ORAL | Status: DC | PRN
  Filled 2012-03-01: qty 1

## 2012-03-01 MED ORDER — OXYTOCIN 40 UNITS IN LACTATED RINGERS INFUSION - SIMPLE MED
1.0000 m[IU]/min | INTRAVENOUS | Status: DC
  Administered 2012-03-01 (×2): 2 m[IU]/min via INTRAVENOUS

## 2012-03-01 MED ORDER — MEPERIDINE HCL 25 MG/ML IJ SOLN
INTRAMUSCULAR | Status: AC
  Filled 2012-03-01: qty 1

## 2012-03-01 MED ORDER — MORPHINE SULFATE 0.5 MG/ML IJ SOLN
INTRAMUSCULAR | Status: AC
  Filled 2012-03-01: qty 10

## 2012-03-01 MED ORDER — PREDNISONE 10 MG PO TABS: 10.0000 mg | ORAL_TABLET | Freq: Every day | ORAL | Status: DC

## 2012-03-01 MED ORDER — LABETALOL HCL 5 MG/ML IV SOLN
10.0000 mg | INTRAVENOUS | Status: DC | PRN
  Filled 2012-03-01: qty 4

## 2012-03-01 MED ORDER — PHENYLEPHRINE HCL 10 MG/ML IJ SOLN
INTRAMUSCULAR | Status: DC | PRN
  Administered 2012-03-01: 40 ug via INTRAVENOUS
  Administered 2012-03-01: 80 ug via INTRAVENOUS
  Administered 2012-03-01: 40 ug via INTRAVENOUS
  Administered 2012-03-01 (×3): 80 ug via INTRAVENOUS

## 2012-03-01 MED ORDER — DIAZEPAM 5 MG/ML IJ SOLN
10.0000 mg | INTRAMUSCULAR | Status: DC
  Filled 2012-03-01: qty 2

## 2012-03-01 MED ORDER — PHENYLEPHRINE 40 MCG/ML (10ML) SYRINGE FOR IV PUSH (FOR BLOOD PRESSURE SUPPORT): 80.0000 ug | PREFILLED_SYRINGE | INTRAVENOUS | Status: DC | PRN

## 2012-03-01 MED ORDER — SCOPOLAMINE 1 MG/3DAYS TD PT72
1.0000 | MEDICATED_PATCH | Freq: Once | TRANSDERMAL | Status: DC
  Administered 2012-03-01: 1.5 mg via TRANSDERMAL

## 2012-03-01 MED ORDER — ONDANSETRON HCL 4 MG/2ML IJ SOLN
INTRAMUSCULAR | Status: DC | PRN
  Administered 2012-03-01: 4 mg via INTRAVENOUS

## 2012-03-01 MED ORDER — PREDNISONE 20 MG PO TABS
20.0000 mg | ORAL_TABLET | Freq: Every day | ORAL | Status: DC
  Administered 2012-03-02 – 2012-03-04 (×3): 20 mg via ORAL
  Filled 2012-03-01 (×3): qty 1

## 2012-03-01 MED ORDER — KETOROLAC TROMETHAMINE 60 MG/2ML IM SOLN
INTRAMUSCULAR | Status: AC
  Filled 2012-03-01: qty 2

## 2012-03-01 MED ORDER — DIPHENHYDRAMINE HCL 50 MG/ML IJ SOLN: 12.5000 mg | INTRAMUSCULAR | Status: DC | PRN

## 2012-03-01 MED ORDER — MORPHINE SULFATE (PF) 0.5 MG/ML IJ SOLN
INTRAMUSCULAR | Status: DC | PRN
  Administered 2012-03-01: .5 mg via EPIDURAL
  Administered 2012-03-01: .5 mg via INTRAVENOUS

## 2012-03-01 MED ORDER — TERBUTALINE SULFATE 1 MG/ML IJ SOLN: 0.2500 mg | Freq: Once | INTRAMUSCULAR | Status: DC | PRN

## 2012-03-01 MED ORDER — FENTANYL 2.5 MCG/ML BUPIVACAINE 1/10 % EPIDURAL INFUSION (WH - ANES)
14.0000 mL/h | INTRAMUSCULAR | Status: DC
  Administered 2012-03-01 (×4): 14 mL/h via EPIDURAL
  Filled 2012-03-01 (×4): qty 60

## 2012-03-01 MED ORDER — VALACYCLOVIR HCL 500 MG PO TABS
500.0000 mg | ORAL_TABLET | Freq: Two times a day (BID) | ORAL | Status: DC
  Filled 2012-03-01 (×3): qty 1

## 2012-03-01 MED ORDER — FENTANYL CITRATE 0.05 MG/ML IJ SOLN
INTRAMUSCULAR | Status: AC
  Filled 2012-03-01: qty 2

## 2012-03-01 MED ORDER — MORPHINE SULFATE (PF) 0.5 MG/ML IJ SOLN
INTRAMUSCULAR | Status: DC | PRN
  Administered 2012-03-01: 4 mg via EPIDURAL

## 2012-03-01 MED ORDER — EPHEDRINE 5 MG/ML INJ
10.0000 mg | INTRAVENOUS | Status: DC | PRN
  Filled 2012-03-01: qty 4

## 2012-03-01 MED ORDER — EPHEDRINE 5 MG/ML INJ: 10.0000 mg | INTRAVENOUS | Status: DC | PRN

## 2012-03-01 MED ORDER — LACTATED RINGERS IV SOLN: 500.0000 mL | Freq: Once | INTRAVENOUS | Status: DC

## 2012-03-01 MED ORDER — MEPERIDINE HCL 25 MG/ML IJ SOLN
6.2500 mg | INTRAMUSCULAR | Status: AC | PRN
  Administered 2012-03-01 (×2): 6.25 mg via INTRAVENOUS

## 2012-03-01 MED ORDER — LIDOCAINE HCL (PF) 1 % IJ SOLN
INTRAMUSCULAR | Status: DC | PRN
  Administered 2012-03-01 (×3): 4 mL

## 2012-03-01 MED ORDER — ZOLPIDEM TARTRATE 5 MG PO TABS: 5.0000 mg | ORAL_TABLET | Freq: Every evening | ORAL | Status: DC | PRN

## 2012-03-01 MED ORDER — PHENYLEPHRINE 40 MCG/ML (10ML) SYRINGE FOR IV PUSH (FOR BLOOD PRESSURE SUPPORT)
PREFILLED_SYRINGE | INTRAVENOUS | Status: AC
  Filled 2012-03-01: qty 10

## 2012-03-01 MED ORDER — OXYTOCIN 10 UNIT/ML IJ SOLN
40.0000 [IU] | INTRAVENOUS | Status: DC | PRN
  Administered 2012-03-01: 40 [IU] via INTRAVENOUS

## 2012-03-01 MED ORDER — SCOPOLAMINE 1 MG/3DAYS TD PT72
MEDICATED_PATCH | TRANSDERMAL | Status: AC
  Filled 2012-03-01: qty 1

## 2012-03-01 MED ORDER — PHENYLEPHRINE 40 MCG/ML (10ML) SYRINGE FOR IV PUSH (FOR BLOOD PRESSURE SUPPORT)
80.0000 ug | PREFILLED_SYRINGE | INTRAVENOUS | Status: DC | PRN
  Filled 2012-03-01: qty 5

## 2012-03-01 MED ORDER — LACTATED RINGERS IV SOLN
INTRAVENOUS | Status: DC | PRN
  Administered 2012-03-01: 21:00:00 via INTRAVENOUS

## 2012-03-01 MED ORDER — SODIUM BICARBONATE 8.4 % IV SOLN
INTRAVENOUS | Status: DC | PRN
  Administered 2012-03-01 (×2): 5 mL via EPIDURAL

## 2012-03-01 MED ORDER — KETOROLAC TROMETHAMINE 60 MG/2ML IM SOLN
60.0000 mg | Freq: Once | INTRAMUSCULAR | Status: AC | PRN
  Administered 2012-03-01: 60 mg via INTRAMUSCULAR

## 2012-03-01 MED ORDER — FENTANYL CITRATE 0.05 MG/ML IJ SOLN: 25.0000 ug | INTRAMUSCULAR | Status: DC | PRN

## 2012-03-01 MED ORDER — ONDANSETRON HCL 4 MG/2ML IJ SOLN
INTRAMUSCULAR | Status: AC
  Filled 2012-03-01: qty 2

## 2012-03-01 SURGICAL SUPPLY — 29 items
CLOTH BEACON ORANGE TIMEOUT ST (SAFETY) ×2 IMPLANT
DERMABOND ADVANCED (GAUZE/BANDAGES/DRESSINGS) ×1
DERMABOND ADVANCED .7 DNX12 (GAUZE/BANDAGES/DRESSINGS) ×1 IMPLANT
DRESSING TELFA 8X3 (GAUZE/BANDAGES/DRESSINGS) IMPLANT
DRSG COVADERM 4X10 (GAUZE/BANDAGES/DRESSINGS) IMPLANT
ELECT REM PT RETURN 9FT ADLT (ELECTROSURGICAL) ×2
ELECTRODE REM PT RTRN 9FT ADLT (ELECTROSURGICAL) ×1 IMPLANT
EXTRACTOR VACUUM M CUP 4 TUBE (SUCTIONS) IMPLANT
GAUZE SPONGE 4X4 12PLY STRL LF (GAUZE/BANDAGES/DRESSINGS) ×4 IMPLANT
GLOVE BIO SURGEON STRL SZ7 (GLOVE) ×4 IMPLANT
GOWN PREVENTION PLUS LG XLONG (DISPOSABLE) ×4 IMPLANT
KIT ABG SYR 3ML LUER SLIP (SYRINGE) IMPLANT
NEEDLE HYPO 25X5/8 SAFETYGLIDE (NEEDLE) IMPLANT
NS IRRIG 1000ML POUR BTL (IV SOLUTION) ×2 IMPLANT
PACK C SECTION WH (CUSTOM PROCEDURE TRAY) ×2 IMPLANT
PAD ABD 7.5X8 STRL (GAUZE/BANDAGES/DRESSINGS) IMPLANT
PAD OB MATERNITY 4.3X12.25 (PERSONAL CARE ITEMS) IMPLANT
RTRCTR C-SECT PINK 25CM LRG (MISCELLANEOUS) IMPLANT
RTRCTR C-SECT PINK 34CM XLRG (MISCELLANEOUS) IMPLANT
SLEEVE SCD COMPRESS KNEE MED (MISCELLANEOUS) IMPLANT
STAPLER VISISTAT 35W (STAPLE) IMPLANT
SUT CHROMIC 1 CTX 36 (SUTURE) ×6 IMPLANT
SUT PDS AB 0 CTX 60 (SUTURE) ×2 IMPLANT
SUT VIC AB 2-0 CT1 27 (SUTURE) ×1
SUT VIC AB 2-0 CT1 TAPERPNT 27 (SUTURE) ×1 IMPLANT
SUT VIC AB 4-0 KS 27 (SUTURE) IMPLANT
TOWEL OR 17X24 6PK STRL BLUE (TOWEL DISPOSABLE) ×4 IMPLANT
TRAY FOLEY CATH 14FR (SET/KITS/TRAYS/PACK) ×2 IMPLANT
WATER STERILE IRR 1000ML POUR (IV SOLUTION) ×2 IMPLANT

## 2012-03-01 NOTE — Op Note (Signed)
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 Waynard Reeds Assistant: None Operative Findings: Vigorous female infant in the vertex OP presentation, asynclitic, weighing 7#7 with apgars of 8 & 9. Normal appearing ovaries, tubes and uterus. Specimen: Placenta for disposal EBL: Total I/O In: 2000 [I.V.:2000] Out: 900 [Urine:200; Blood:700]   Procedure:Taylor [name] is an 31 year old gravida 1 para 1 at 33 weeks and 6 days estimated gestational age who presents for cesarean section. The patient was noted to have elevated blood pressures in the last few weeks of her pregnancy. A 24-hour urine was greater than 300 mg of protein and she began to develop severe range pressures therefore the decision was made to proceed with induction of labor for preeclampsia. She underwent Cytotec induction and per grasped quickly in labor, however with pushing she pushed for approximately 2 hours with minimal descent of the fetal head and was becoming fatigued. She rested, in labor down for approximately 2 hours and then pushed for another 45 minutes again with no descent of the fetal head. It was felt that the presenting part was too high for safe application of a vacuum and the decision was made to proceed with cesarean section. Following the appropriate informed consent the patient was brought to the operating room where spinal anesthesia was administered and found to be adequate. She was placed in the dorsal supine position with a leftward tilt. She was prepped and draped in the normal sterile fashion. Scalpel was then used to make a Pfannenstiel skin incision which was carried down to the underlying layers of soft tissue to the fascia. The fascia was incised in the midline and the fascial incision was extended laterally with Mayo scissors. The superior aspect of the fascial incision was  grasped with Coker clamps x2, tented up and the rectus muscles dissected off sharply with the electrocautery unit area and the same procedure was repeated on the inferior aspect of the fascial incision. The rectus muscles were separated in the midline. The abdominal peritoneum was identified, tented up, entered sharply, and the incision was extended superiorly and inferiorly with good visualization of the bladder. The Alexis retractor was then deployed. The vesicouterine peritoneum was identified, tented up, entered sharply, and the bladder flap was created digitally. Scalpel was then used to make a low transverse incision on the uterus which was extended laterally with both blunt dissection. The fetal vertex was identified deep in the pelvis, delivered easily through the uterine incision followed by the body. The infant was bulb suctioned on the operative field, cried vigorously, cord was clamped and cut and the infant was passed to the waiting neonatologist. Placenta was then delivered spontaneously, the uterus was cleared of all clot and debris. The uterine incision was repaired with #1 chromic in running locked fashion followed by a second imbricating layer. Ovaries and tubes were inspected and normal. The Alexis retractor was removed. The uterus was returned to the abdominal cavity the abdominal cavity was cleared of all clot and debris. The abdominal peritoneum was reapproximated with 2-0 Vicryl in a running fashion, the rectus muscles was reapproximated with #1 chromic in a running fashion. The fascia was closed with a #1 PDS in a running fashion. The skin was closed with 4-0 vicryl in a subcuticular fashion and Dermabond. All sponge lap and needle counts were correct x2. Patient tolerated the procedure well and recovered in stable condition following the procedure.

## 2012-03-01 NOTE — Transfer of Care (Signed)
Immediate Anesthesia Transfer of Care Note  Patient: Taylor [name]  Procedure(s) Performed: Procedure(s) (LRB) with comments: CESAREAN SECTION (N/A)  Patient Location: PACU  Anesthesia Type: Epidural  Level of Consciousness: awake, alert  and oriented  Airway & Oxygen Therapy: Patient Spontanous Breathing  Post-op Assessment: Report given to PACU RN and Post -op Vital signs reviewed and stable  Post vital signs: Reviewed and stable  Complications: No apparent anesthesia complications

## 2012-03-01 NOTE — Anesthesia Procedure Notes (Signed)
Epidural Patient location during procedure: OB Start time: 03/01/2012 8:25 AM  Staffing Performed by: anesthesiologist   Preanesthetic Checklist Completed: patient identified, site marked, surgical consent, pre-op evaluation, timeout performed, IV checked, risks and benefits discussed and monitors and equipment checked  Epidural Patient position: sitting Prep: site prepped and draped and DuraPrep Patient monitoring: continuous pulse ox and blood pressure Approach: midline Injection technique: LOR air  Needle:  Needle type: Tuohy  Needle gauge: 17 G Needle length: 9 cm and 9 Needle insertion depth: 5 cm cm Catheter type: closed end flexible Catheter size: 19 Gauge Catheter at skin depth: 10 cm Test dose: negative  Assessment Events: blood not aspirated, injection not painful, no injection resistance, negative IV test and no paresthesia  Additional Notes Discussed risk of headache, infection, bleeding, nerve injury and failed or incomplete block.  Patient voices understanding and wishes to proceed. Reason for block:procedure for pain

## 2012-03-01 NOTE — Anesthesia Preprocedure Evaluation (Signed)
Anesthesia Evaluation  Patient identified by MRN, date of birth, ID band Patient awake    Reviewed: Allergy & Precautions, H&P , NPO status , Patient's Chart, lab work & pertinent test results, reviewed documented beta blocker date and time   History of Anesthesia Complications Negative for: history of anesthetic complications  Airway Mallampati: II TM Distance: >3 FB Neck ROM: full    Dental  (+) Teeth Intact   Pulmonary neg pulmonary ROS,  breath sounds clear to auscultation        Cardiovascular hypertension (preeclampsia), Rhythm:regular Rate:Normal     Neuro/Psych PSYCHIATRIC DISORDERS (anxiety/depression) negative neurological ROS     GI/Hepatic negative GI ROS, Neg liver ROS,   Endo/Other  negative endocrine ROSMorbid obesity  Renal/GU negative Renal ROS Bladder dysfunction (interstitial cystitis)      Musculoskeletal   Abdominal   Peds  Hematology negative hematology ROS (+)   Anesthesia Other Findings Skin rash/ulcers with pregnancy - has been on prednisone, atarax  Reproductive/Obstetrics (+) Pregnancy                           Anesthesia Physical Anesthesia Plan  ASA: III  Anesthesia Plan: Epidural   Post-op Pain Management:    Induction:   Airway Management Planned:   Additional Equipment:   Intra-op Plan:   Post-operative Plan:   Informed Consent: I have reviewed the patients History and Physical, chart, labs and discussed the procedure including the risks, benefits and alternatives for the proposed anesthesia with the patient or authorized representative who has indicated his/her understanding and acceptance.     Plan Discussed with:   Anesthesia Plan Comments:         Anesthesia Quick Evaluation

## 2012-03-01 NOTE — Progress Notes (Signed)
Dr. Claiborne Billings called and notified of patient status, patient request for epidural once in active labor, SVE, and interventions.  New order received for epidural upon patient request.  Patient may have IV benadryl for skin irritation.

## 2012-03-02 LAB — CBC
HCT: 29 % — ABNORMAL LOW (ref 36.0–46.0)
MCV: 88.7 fL (ref 78.0–100.0)
RBC: 3.27 MIL/uL — ABNORMAL LOW (ref 3.87–5.11)
RDW: 16.5 % — ABNORMAL HIGH (ref 11.5–15.5)
WBC: 28.6 10*3/uL — ABNORMAL HIGH (ref 4.0–10.5)

## 2012-03-02 MED ORDER — LACTATED RINGERS IV BOLUS (SEPSIS): 500.0000 mL | Freq: Once | INTRAVENOUS | Status: DC

## 2012-03-02 MED ORDER — NALOXONE HCL 0.4 MG/ML IJ SOLN: 0.4000 mg | INTRAMUSCULAR | Status: DC | PRN

## 2012-03-02 MED ORDER — IBUPROFEN 600 MG PO TABS
600.0000 mg | ORAL_TABLET | Freq: Four times a day (QID) | ORAL | Status: DC
  Administered 2012-03-02 – 2012-03-04 (×9): 600 mg via ORAL
  Filled 2012-03-02 (×8): qty 1

## 2012-03-02 MED ORDER — MENTHOL 3 MG MT LOZG: 1.0000 | LOZENGE | OROMUCOSAL | Status: DC | PRN

## 2012-03-02 MED ORDER — SIMETHICONE 80 MG PO CHEW
80.0000 mg | CHEWABLE_TABLET | Freq: Three times a day (TID) | ORAL | Status: DC
  Administered 2012-03-02 – 2012-03-03 (×8): 80 mg via ORAL

## 2012-03-02 MED ORDER — SODIUM CHLORIDE 0.9 % IJ SOLN: 3.0000 mL | INTRAMUSCULAR | Status: DC | PRN

## 2012-03-02 MED ORDER — OXYTOCIN 40 UNITS IN LACTATED RINGERS INFUSION - SIMPLE MED: 62.5000 mL/h | INTRAVENOUS | Status: AC

## 2012-03-02 MED ORDER — TETANUS-DIPHTH-ACELL PERTUSSIS 5-2.5-18.5 LF-MCG/0.5 IM SUSP: 0.5000 mL | Freq: Once | INTRAMUSCULAR | Status: DC

## 2012-03-02 MED ORDER — DIPHENHYDRAMINE HCL 50 MG/ML IJ SOLN: 12.5000 mg | INTRAMUSCULAR | Status: DC | PRN

## 2012-03-02 MED ORDER — IBUPROFEN 600 MG PO TABS
600.0000 mg | ORAL_TABLET | Freq: Four times a day (QID) | ORAL | Status: DC | PRN
  Filled 2012-03-02: qty 1

## 2012-03-02 MED ORDER — WITCH HAZEL-GLYCERIN EX PADS: 1.0000 "application " | MEDICATED_PAD | CUTANEOUS | Status: DC | PRN

## 2012-03-02 MED ORDER — LACTATED RINGERS IV SOLN
INTRAVENOUS | Status: DC
  Administered 2012-03-02 (×2): via INTRAVENOUS

## 2012-03-02 MED ORDER — SENNOSIDES-DOCUSATE SODIUM 8.6-50 MG PO TABS
2.0000 | ORAL_TABLET | Freq: Every day | ORAL | Status: DC
  Administered 2012-03-02 – 2012-03-03 (×2): 2 via ORAL

## 2012-03-02 MED ORDER — ONDANSETRON HCL 4 MG PO TABS: 4.0000 mg | ORAL_TABLET | ORAL | Status: DC | PRN

## 2012-03-02 MED ORDER — OMEGA-3-ACID ETHYL ESTERS 1 G PO CAPS
1.0000 g | ORAL_CAPSULE | Freq: Every day | ORAL | Status: DC
  Administered 2012-03-02 – 2012-03-03 (×2): 1 g via ORAL
  Filled 2012-03-02 (×3): qty 1

## 2012-03-02 MED ORDER — SIMETHICONE 80 MG PO CHEW: 80.0000 mg | CHEWABLE_TABLET | ORAL | Status: DC | PRN

## 2012-03-02 MED ORDER — KETOROLAC TROMETHAMINE 30 MG/ML IJ SOLN: 30.0000 mg | Freq: Four times a day (QID) | INTRAMUSCULAR | Status: AC | PRN

## 2012-03-02 MED ORDER — OMEGA-3 FATTY ACIDS 1000 MG PO CAPS
1.0000 g | ORAL_CAPSULE | Freq: Every day | ORAL | Status: DC
  Filled 2012-03-02 (×2): qty 1

## 2012-03-02 MED ORDER — ONDANSETRON HCL 4 MG/2ML IJ SOLN: 4.0000 mg | Freq: Three times a day (TID) | INTRAMUSCULAR | Status: DC | PRN

## 2012-03-02 MED ORDER — NALBUPHINE HCL 10 MG/ML IJ SOLN
5.0000 mg | INTRAMUSCULAR | Status: DC | PRN
  Filled 2012-03-02: qty 1

## 2012-03-02 MED ORDER — NALOXONE HCL 0.4 MG/ML IJ SOLN
1.0000 ug/kg/h | INTRAMUSCULAR | Status: DC | PRN
  Filled 2012-03-02: qty 2.5

## 2012-03-02 MED ORDER — ZOLPIDEM TARTRATE 5 MG PO TABS: 5.0000 mg | ORAL_TABLET | Freq: Every evening | ORAL | Status: DC | PRN

## 2012-03-02 MED ORDER — DIPHENHYDRAMINE HCL 25 MG PO CAPS
25.0000 mg | ORAL_CAPSULE | ORAL | Status: DC | PRN
  Administered 2012-03-02 – 2012-03-03 (×2): 25 mg via ORAL
  Filled 2012-03-02 (×2): qty 1

## 2012-03-02 MED ORDER — OXYCODONE-ACETAMINOPHEN 5-325 MG PO TABS
1.0000 | ORAL_TABLET | ORAL | Status: DC | PRN
  Administered 2012-03-02 – 2012-03-03 (×3): 1 via ORAL
  Administered 2012-03-03 – 2012-03-04 (×2): 2 via ORAL
  Filled 2012-03-02 (×2): qty 2
  Filled 2012-03-02 (×3): qty 1

## 2012-03-02 MED ORDER — PRENATAL MULTIVITAMIN CH
1.0000 | ORAL_TABLET | Freq: Every day | ORAL | Status: DC
  Administered 2012-03-02 – 2012-03-03 (×2): 1 via ORAL
  Filled 2012-03-02 (×2): qty 1

## 2012-03-02 MED ORDER — DIPHENHYDRAMINE HCL 50 MG/ML IJ SOLN: 25.0000 mg | INTRAMUSCULAR | Status: DC | PRN

## 2012-03-02 MED ORDER — DULOXETINE HCL 20 MG PO CPEP
20.0000 mg | ORAL_CAPSULE | Freq: Every day | ORAL | Status: DC
  Administered 2012-03-02 – 2012-03-03 (×2): 20 mg via ORAL
  Filled 2012-03-02 (×3): qty 1

## 2012-03-02 MED ORDER — MUPIROCIN CALCIUM 2 % EX CREA
TOPICAL_CREAM | Freq: Three times a day (TID) | CUTANEOUS | Status: DC
  Administered 2012-03-02 (×2): 1 via TOPICAL
  Administered 2012-03-02 – 2012-03-03 (×3): via TOPICAL
  Filled 2012-03-02 (×2): qty 15

## 2012-03-02 MED ORDER — HYDROXYZINE HCL 10 MG PO TABS
10.0000 mg | ORAL_TABLET | Freq: Every day | ORAL | Status: DC
  Administered 2012-03-02 – 2012-03-03 (×3): 10 mg via ORAL
  Filled 2012-03-02 (×3): qty 1

## 2012-03-02 MED ORDER — DIBUCAINE 1 % RE OINT: 1.0000 "application " | TOPICAL_OINTMENT | RECTAL | Status: DC | PRN

## 2012-03-02 MED ORDER — LANOLIN HYDROUS EX OINT: 1.0000 "application " | TOPICAL_OINTMENT | CUTANEOUS | Status: DC | PRN

## 2012-03-02 MED ORDER — DIPHENHYDRAMINE HCL 25 MG PO CAPS: 25.0000 mg | ORAL_CAPSULE | Freq: Four times a day (QID) | ORAL | Status: DC | PRN

## 2012-03-02 MED ORDER — ONDANSETRON HCL 4 MG/2ML IJ SOLN: 4.0000 mg | INTRAMUSCULAR | Status: DC | PRN

## 2012-03-02 MED ORDER — METOCLOPRAMIDE HCL 5 MG/ML IJ SOLN: 10.0000 mg | Freq: Three times a day (TID) | INTRAMUSCULAR | Status: DC | PRN

## 2012-03-02 MED FILL — Diazepam Inj 5 MG/ML: INTRAMUSCULAR | Qty: 2 | Status: AC

## 2012-03-02 NOTE — Progress Notes (Signed)
  CTSP  Pt forgot to mention at my po visit that she has a large area of pemphigoid on her R buttock.  Nurse asked me to come back to evaluate.  There is a 20-30 cm denuded area of buttock only epidermis deep; it is red but otherwise healthy appearing and not weeping or infected.  Pt was told by derm not to cover lesions.  Will have nurse help with application of antibiotic cream and d/c underwear.

## 2012-03-02 NOTE — Progress Notes (Signed)
03/02/12 1015 L. Floyce Stakes (late entry)- called Dr. Henderson Cloud to see patient's  right buttock area.  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 Patient has blister type areas on both legs, back and some areas on both arms.  On the right buttock area skin has sheared off , no drainage noted. Dr. Henderson Cloud in room and stated to put cream 2% over all areas and leave open to air with no underwear or pads on.  RN applied cream and removed all underwear and pads. Urine output is decreased and amber color, 60 cc's of urine emptied in foley from arouond 0730 to 1000. Dr. Henderson Cloud ordered 500 cc bolus which is started around 1015 am. Lungs clear.

## 2012-03-02 NOTE — Progress Notes (Signed)
0830 03/03/11 L. Floyce Stakes [facility]- received call from [facility] ) RN stating that she wanted to talk to attending MD regarding skin care consult. She stated that this diagnosis was out for her scope of practice. Dr. Henderson Cloud number given to her.

## 2012-03-02 NOTE — Progress Notes (Signed)
UR Chart review completed.  

## 2012-03-02 NOTE — Anesthesia Postprocedure Evaluation (Signed)
  Anesthesia Post-op Note  Patient: Taylor [name]  Procedure(s) Performed: Procedure(s) (LRB) with comments: CESAREAN SECTION (N/A)  Patient Location: Mother/Baby  Anesthesia Type: Epidural  Level of Consciousness: awake  Airway and Oxygen Therapy: Patient Spontanous Breathing  Post-op Pain: none  Post-op Assessment: Patient's Cardiovascular Status Stable, Respiratory Function Stable, Patent Airway, No signs of Nausea or vomiting, Adequate PO intake, Pain level controlled, No headache, No backache, No residual numbness and No residual motor weakness  Post-op Vital Signs: Reviewed and stable  Complications: No apparent anesthesia complications

## 2012-03-02 NOTE — Progress Notes (Addendum)
  Patient is eating, ambulating, foley still in.  Pain control is good.  Filed Vitals:   03/02/12 0045 03/02/12 0120 03/02/12 0250 03/02/12 0400  BP: 132/84 130/86 114/75 121/79  Pulse: 89 86 101 89  Temp:  98.9 F (37.2 C) 98.6 F (37 C) 98.8 F (37.1 C)  TempSrc:      Resp: 19 18 18 18   Height:      Weight:      SpO2: 99% 96% 95% 96%    lungs:   clear to auscultation cor:    RRR Abdomen:  soft, appropriate tenderness, incisions intact and without erythema or exudate ex:    no cords   Lab Results  Component Value Date   WBC 28.6* 03/02/2012   HGB 9.5* 03/02/2012   HCT 29.0* 03/02/2012   MCV 88.7 03/02/2012   PLT 193 03/02/2012    --/--/A POS, A POS (09/10 1430)/RI  A/P    Post operative day 1.  PIH- BPs and plt stable  Routine post op and postpartum care.  Expect d/c tomorrow.  Percocet for pain control.  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  Pt to continue prednisone po and topical mupirocin.

## 2012-03-02 NOTE — Addendum Note (Signed)
Addendum  created 03/02/12 0900 by Suella Grove, CRNA   Modules edited:Notes Section

## 2012-03-02 NOTE — Anesthesia Postprocedure Evaluation (Signed)
Anesthesia Post Note  Patient: Taylor [name]  Procedure(s) Performed: Procedure(s) (LRB): CESAREAN SECTION (N/A)  Anesthesia type: Epidural  Patient location: PACU  Post pain: Pain level controlled  Post assessment: Post-op Vital signs reviewed  Post vital signs: stable  Level of consciousness: awake  Complications: No apparent anesthesia complications

## 2012-03-02 NOTE — Consult Note (Signed)
WOC consult Note Consult requested for pemphoid gestationis.  This is beyond [facility] scope of practice and topical care is usually not effective; treatment is directed towards prescription steroids and topical creams. Discussed plan of care with primary team and they will consult dermatology for further plan of care. Will not ollow further unless re-consulted.  [address] RN, MSN, [employer]  [phone]

## 2012-03-03 ENCOUNTER — Encounter (HOSPITAL_COMMUNITY): Payer: Self-pay | Admitting: Obstetrics and Gynecology

## 2012-03-03 MED ORDER — FUROSEMIDE 20 MG PO TABS
20.0000 mg | ORAL_TABLET | Freq: Once | ORAL | Status: AC
  Administered 2012-03-03: 20 mg via ORAL
  Filled 2012-03-03: qty 1

## 2012-03-03 NOTE — Progress Notes (Signed)
POD#2 Pt without c/o. Lochia-mild.\ IMP/ stable Plan/ will discharge in am.

## 2012-03-03 NOTE — Progress Notes (Signed)
Patient complaining of increased sharp pains from blisters on legs.  Blisters filling up more with fluid due to increased bilateral lower extremity edema post partum.  Called Dr. Dareen Piano who ordered lasix x1 dose now.  Will continue to monitor.  Earl Gala, [facility] [place]

## 2012-03-03 NOTE — Progress Notes (Signed)

## 2012-03-04 MED ORDER — OXYCODONE-ACETAMINOPHEN 5-325 MG PO TABS: 1.0000 | ORAL_TABLET | ORAL | Status: AC | PRN

## 2012-03-04 NOTE — Discharge Summary (Signed)
Obstetric Discharge Summary Reason for Admission: induction of labor and preeclampsia Prenatal Procedures: ultrasound Intrapartum Procedures: cesarean: low cervical, transverse Postpartum Procedures: none Complications-Operative and Postpartum: none Hemoglobin  Date Value Range Status  03/02/2012 9.5* 12.0 - 15.0 g/dL Final     HCT  Date Value Range Status  03/02/2012 29.0* 36.0 - 46.0 % Final    Physical Exam:  General: alert Lochia: appropriate Uterine Fundus: firm Incision: healing well DVT Evaluation: No evidence of DVT seen on physical exam.  Discharge Diagnoses: Term Pregnancy-delivered  Discharge Information: Date: 03/04/2012 Activity: pelvic rest Diet: routine Medications: PNV, Ibuprofen and Percocet Condition: stable Instructions: refer to practice specific booklet Discharge to: home Follow-up Information    Follow up with Almon Hercules., MD. Schedule an appointment as soon as possible for a visit in 2 weeks.   Contact information:   [address] [place] [address] [phone]          Newborn Data: Live born female  Birth Weight: 7 lb 7.2 oz (3380 g) APGAR: 8, 9  Home with mother.  ANDERSON,MARK E 03/04/2012, 6:54 AM

## 2012-03-04 NOTE — Progress Notes (Signed)
POD#3 Pt without c/o. Wants to go home. WBC elevated secondary to steroids. Incision-wnl. IMP/ stable.  Plan Kendell Bane.

## 2012-03-04 NOTE — Clinical Social Work Note (Signed)
Pt discharged before SW to assess for history of anxiety and depression.

## 2014-04-22 ENCOUNTER — Encounter (HOSPITAL_COMMUNITY): Payer: Self-pay | Admitting: Obstetrics and Gynecology

## 2015-03-30 ENCOUNTER — Encounter (HOSPITAL_BASED_OUTPATIENT_CLINIC_OR_DEPARTMENT_OTHER): Payer: Self-pay | Admitting: *Deleted

## 2015-03-30 ENCOUNTER — Emergency Department (HOSPITAL_BASED_OUTPATIENT_CLINIC_OR_DEPARTMENT_OTHER)
Admission: EM | Admit: 2015-03-30 | Discharge: 2015-03-30 | Disposition: A | Discharge status: Home / Self Care | Payer: BC Managed Care – PPO | Attending: Emergency Medicine | Admitting: Emergency Medicine

## 2015-03-30 DIAGNOSIS — Z87448 Personal history of other diseases of urinary system: Secondary | ICD-10-CM | POA: Diagnosis not present

## 2015-03-30 DIAGNOSIS — F329 Major depressive disorder, single episode, unspecified: Secondary | ICD-10-CM | POA: Diagnosis not present

## 2015-03-30 DIAGNOSIS — F419 Anxiety disorder, unspecified: Secondary | ICD-10-CM | POA: Diagnosis not present

## 2015-03-30 DIAGNOSIS — B958 Unspecified staphylococcus as the cause of diseases classified elsewhere: Secondary | ICD-10-CM

## 2015-03-30 DIAGNOSIS — A4901 Methicillin susceptible Staphylococcus aureus infection, unspecified site: Secondary | ICD-10-CM | POA: Diagnosis not present

## 2015-03-30 DIAGNOSIS — Z79899 Other long term (current) drug therapy: Secondary | ICD-10-CM | POA: Diagnosis not present

## 2015-03-30 DIAGNOSIS — R21 Rash and other nonspecific skin eruption: Secondary | ICD-10-CM | POA: Diagnosis present

## 2015-03-30 MED ORDER — DOXYCYCLINE HYCLATE 100 MG PO CAPS: 100.0000 mg | ORAL_CAPSULE | Freq: Two times a day (BID) | ORAL | Status: DC

## 2015-03-30 MED ORDER — MUPIROCIN CALCIUM 2 % EX CREA: 1.0000 "application " | TOPICAL_CREAM | Freq: Two times a day (BID) | CUTANEOUS | Status: DC

## 2015-03-30 NOTE — Discharge Instructions (Signed)

## 2015-03-30 NOTE — ED Notes (Signed)
Patient has abscess on left forearm that appeared 2 days ago. Currently taking clindamycin, but it has grown worse.  Seen at urgent care yesterday

## 2015-03-30 NOTE — ED Provider Notes (Signed)
CSN: [id number]     Arrival date & time 03/30/15  1034 History   First MD Initiated Contact with Patient 03/30/15 1105     Chief Complaint  Patient presents with  . Cellulitis     (Consider location/radiation/quality/duration/timing/severity/associated sxs/prior Treatment) Patient is a 34 y.o. female presenting with rash. The history is provided by the patient.  Rash Location:  Shoulder/arm Shoulder/arm rash location:  L forearm Quality: blistering (of single lesion centrally with yellow discharge), itchiness and redness   Severity:  Moderate Onset quality:  Gradual Duration:  2 days Timing:  Constant Progression:  Worsening Chronicity:  New Relieved by:  Nothing Worsened by:  Nothing tried Ineffective treatments: oral clindamycin. Associated symptoms: no fever     Past Medical History  Diagnosis Date  . Anxiety   . Depression   . IC (interstitial cystitis)   . Syncope   . Tachycardia    Past Surgical History  Procedure Laterality Date  . Colonoscopy    . Cesarean section  03/01/2012    Procedure: CESAREAN SECTION;  Surgeon: Freddrick March. Tenny Craw, MD;  Location: [facility];  Service: Obstetrics;  Laterality: N/A;   No family history on file. Social History  Substance Use Topics  . Smoking status: Never Smoker   . Smokeless tobacco: Never Used  . Alcohol Use: Yes     Comment: pt had 3 beers last night   OB History    Gravida Para Term Preterm AB TAB SAB Ectopic Multiple Living   0 0 0 0 0 0 1     Review of Systems  Constitutional: Negative for fever.  Skin: Positive for rash.  All other systems reviewed and are negative.     Allergies  Review of patient's allergies indicates no known allergies.  Home Medications   Prior to Admission medications   Medication Sig Start Date End Date Taking? Authorizing Provider  Fish Oil OIL by Does not apply route.   Yes Historical Provider, MD  DULoxetine (CYMBALTA) 20 MG capsule Take 20 mg by mouth daily.    Historical  Provider, MD  hydrOXYzine (ATARAX/VISTARIL) 10 MG tablet Take 10 mg by mouth daily.     Historical Provider, MD  predniSONE (DELTASONE) 10 MG tablet Take 10 mg by mouth See admin instructions. Taper dose as directed by physician    Historical Provider, MD  Prenatal Vit-Fe Fumarate-FA (PRENATAL MULTIVITAMIN) TABS Take 1 tablet by mouth daily.    Historical Provider, MD   BP 114/77 mmHg  Pulse 96  Temp(Src) 98.3 F (36.8 C) (Oral)  Resp 16  Ht  (1.626 m)  Wt 130 lb (58.968 kg)  BMI 22.30 kg/m2  SpO2 100% Physical Exam  Constitutional: She is oriented to person, place, and time. She appears well-developed and well-nourished. No distress.  HENT:  Head: Normocephalic.  Eyes: Conjunctivae are normal.  Neck: Neck supple. No tracheal deviation present.  Cardiovascular: Normal rate and regular rhythm.   Pulmonary/Chest: Effort normal. No respiratory distress.  Abdominal: Soft. She exhibits no distension.  Neurological: She is alert and oriented to person, place, and time.  Skin: Skin is warm and dry. Rash (singular blister ruptured over left volar forearm, spreading erythema, pruritic) noted.  Psychiatric: She has a normal mood and affect.    ED Course  Procedures (including critical care time) Emergency Focused Ultrasound Exam Limited Ultrasound of Soft Tissue   Performed and interpreted by Dr. Clydene Pugh Indication: evaluation for infection or foreign body Transverse and Sagittal views  of left forearm are obtained in real time for the purposes of evaluation of skin and underlying soft tissues.  Findings: no heterogeneous fluid collection, + hyperemia/edema of surrounding tissue Interpretation: no abscess, + cellulitis Images archived electronically.  CPT Codes:   Upper extremity 76880-26    Labs Review Labs Reviewed - No data to display  Imaging Review No results found. I have personally reviewed and evaluated these images and lab results as part of my medical  decision-making.   EKG Interpretation None      MDM   Final diagnoses:  Staph infection    34 year old female presents with a blistering lesion over her left forearm for which she has started clindamycin therapy. She is concerned because it has not improved and she is having to take the clindamycin too often at home. Patient appears to be related to staph aureus clinically, we will switch to doxycycline twice a day and apply topical antibiotics to double cover. No evidence of complicating abscess on bedside ultrasound. Plan to follow up with PCP as needed and return precautions discussed for worsening or new concerning symptoms.     Lyndal Pulley, MD 03/31/15 [phone]

## 2019-06-27 ENCOUNTER — Ambulatory Visit: Payer: BC Managed Care – PPO | Attending: Internal Medicine

## 2019-06-27 DIAGNOSIS — Z20822 Contact with and (suspected) exposure to covid-19: Secondary | ICD-10-CM

## 2019-06-28 LAB — NOVEL CORONAVIRUS, NAA: SARS-CoV-2, NAA: NOT DETECTED

## 2020-08-27 ENCOUNTER — Ambulatory Visit: Payer: BC Managed Care – PPO | Admitting: Nurse Practitioner

## 2020-10-29 ENCOUNTER — Ambulatory Visit: Payer: Self-pay | Admitting: Nurse Practitioner

## 2020-12-09 ENCOUNTER — Ambulatory Visit: Payer: Self-pay | Admitting: Nurse Practitioner

## 2020-12-09 DIAGNOSIS — Z0289 Encounter for other administrative examinations: Secondary | ICD-10-CM

## 2021-08-11 ENCOUNTER — Telehealth: Payer: Self-pay | Admitting: Family Medicine

## 2021-08-11 ENCOUNTER — Ambulatory Visit: Payer: Self-pay | Admitting: Family Medicine

## 2021-08-11 NOTE — Telephone Encounter (Signed)
Pt did not show for her 08/11/21 New Pt app. I sent a No Show letter

## 2021-08-12 ENCOUNTER — Encounter: Payer: Self-pay | Admitting: Family Medicine

## 2021-08-12 NOTE — Telephone Encounter (Signed)
2nd no show on new pt visit Dismissal letter sent

## 2021-08-28 NOTE — Progress Notes (Unsigned)
New Patient Office Visit  Subjective:  Patient ID: Taylor [name], female    DOB: 03-Sep-1980  Age: 41 y.o. MRN: [id number]  CC: No chief complaint on file.   HPI AFNAN CADIENTE presents for ***  Past Medical History:  Diagnosis Date   Anxiety    Depression    IC (interstitial cystitis)    Syncope    Tachycardia     Past Surgical History:  Procedure Laterality Date   CESAREAN SECTION  03/01/2012   Procedure: CESAREAN SECTION;  Surgeon: Farrel Gobble. Harrington Challenger, MD;  Location: [facility];  Service: Obstetrics;  Laterality: N/A;   COLONOSCOPY      No family history on file.  Social History   Socioeconomic History   Marital status: Married    Spouse name: Not on file   Number of children: Not on file   Years of education: Not on file   Highest education level: Not on file  Occupational History   Not on file  Tobacco Use   Smoking status: Never   Smokeless tobacco: Never  Substance and Sexual Activity   Alcohol use: Yes    Comment: pt had 3 beers last night   Drug use: No   Sexual activity: Yes    Birth control/protection: None  Other Topics Concern   Not on file  Social History Narrative   Not on file   Social Determinants of Health   Financial Resource Strain: Not on file  Food Insecurity: Not on file  Transportation Needs: Not on file  Physical Activity: Not on file  Stress: Not on file  Social Connections: Not on file  Intimate Partner Violence: Not on file    ROS Review of Systems  Objective:   Today's Vitals: There were no vitals taken for this visit.  Physical Exam  Assessment & Plan:   Problem List Items Addressed This Visit   None   Outpatient Encounter Medications as of 08/11/2021  Medication Sig   albuterol (PROAIR HFA) 108 (90 Base) MCG/ACT inhaler    ALPRAZolam (XANAX) 0.25 MG tablet Take 1 tablet by mouth 2 (two) times daily.   ALPRAZolam (XANAX) 0.5 MG tablet alprazolam 0.5 mg tablet  TAKE 1 TABLET (0.5 MG TOTAL) BY MOUTH 3 (THREE)  TIMES DAILY AS NEEDED FOR SLEEP.   busPIRone (BUSPAR) 10 MG tablet Take 1/2 tablet twice daily for one week then take one tablet twice daily   cetirizine-pseudoephedrine (ZYRTEC-D) 5-120 MG tablet Take by mouth.   clobetasol cream (TEMOVATE) 0.05 % clobetasol 0.05 % topical cream   cyclobenzaprine (FLEXERIL) 5 MG tablet cyclobenzaprine 5 mg tablet   DHA-EPA-Coenzyme Q10-Vitamin E (CO Q-10 VITAMIN E FISH OIL) 60-90-25-200 CAPS by Miscellaneous route.   DHA-EPA-VITAMIN E PO by Does not apply route.   doxycycline (VIBRAMYCIN) 100 MG capsule Take 1 capsule (100 mg total) by mouth 2 (two) times daily.   DULoxetine (CYMBALTA) 20 MG capsule Take 20 mg by mouth daily.   Fish Oil OIL by Does not apply route.   FLUoxetine (PROZAC) 10 MG capsule fluoxetine 10 mg capsule  TAKE ONE CAPSULE DAILY FOR ONE WEEK THEN TAKE 2 CAPSULES DAILY   HYDROcodone bit-homatropine (HYDROMET) 5-1.5 MG/5ML syrup Hydromet 5 mg-1.5 mg/5 mL oral syrup  TAKE 5 MLS BY MOUTH EVERY 4 (FOUR) HOURS AS NEEDED FOR UP TO 5 DAYS.   hydrOXYzine (ATARAX/VISTARIL) 10 MG tablet Take 10 mg by mouth daily.    leuprolide (LUPRON DEPOT, 87-MONTH,) 3.75 MG injection Lupron Depot 3.75 mg  intramuscular syringe kit   MULTIPLE VITAMIN PO Take by mouth.   mupirocin cream (BACTROBAN) 2 % Apply 1 application topically 2 (two) times daily.   mupirocin ointment (BACTROBAN) 2 % mupirocin 2 % topical ointment   naproxen (NAPROSYN) 500 MG tablet Take by mouth.   predniSONE (DELTASONE) 10 MG tablet Take 10 mg by mouth See admin instructions. Taper dose as directed by physician   Prenatal Vit-Fe Fumarate-FA (PRENATAL MULTIVITAMIN) TABS Take 1 tablet by mouth daily.   QUEtiapine (SEROQUEL) 50 MG tablet quetiapine 50 mg tablet  TAKE 1/2 TABLET AT BEDTIME FOR ONE WEEK THEN TAKE 1 TABLET AT BEDTIME   No facility-administered encounter medications on file as of 08/11/2021.    Follow-up: No follow-ups on file.   Libby Maw, MD

## 2021-09-09 NOTE — Telephone Encounter (Signed)
Pt called about receiving no show fee for this date and said that she had talked to someone at the office that day and said she couldn't come because she had a death in the family. Shes said shes not paying for it and asked if the charge can be dropped from this date. Please advise. She request a call back to let her know an answer ?

## 2021-09-11 NOTE — Telephone Encounter (Signed)
Please notify pt that we will waive the fee & she is able to reschedule if she would like. Dismissal will be reversed due to below circumstance. ?

## 2021-09-14 NOTE — Telephone Encounter (Signed)
Billing dept has completed reversal and removed the $50 fee. ?

## 2022-08-26 ENCOUNTER — Telehealth: Payer: Self-pay | Admitting: Family Medicine

## 2022-08-26 NOTE — Telephone Encounter (Signed)
Below encounter from 08/24/2022 and 08/25/2022 I spoke with pt and resolved issue 08/25/2022. Requested bill to be reversed.

## 2022-08-26 NOTE — Telephone Encounter (Signed)
Pt called questioning why she had a 50.00 charge and she had never been here. I found 3 cancellations and a no show. I gave her the billing number. She called back saying they would not help her. I explained that I don't have anything to do with billing and I could not help her. She asked to speak to the manager, I told her she was at lunch and would give her information to her and she would call her back. She called 2 more times that day and then again the next day. She claimed she did not make the appts and did not have a chart. Drue Dun helped her.

## 2023-05-10 ENCOUNTER — Other Ambulatory Visit: Payer: Self-pay

## 2023-05-10 ENCOUNTER — Encounter (HOSPITAL_BASED_OUTPATIENT_CLINIC_OR_DEPARTMENT_OTHER): Payer: Self-pay | Admitting: Obstetrics and Gynecology

## 2023-05-10 DIAGNOSIS — Z01812 Encounter for preprocedural laboratory examination: Secondary | ICD-10-CM | POA: Diagnosis present

## 2023-05-10 NOTE — Progress Notes (Addendum)
Spoke w/ via phone for pre-op interview---Taylor [name] needs dos----  UPT       [name] results------05/11/23 [name] appt for cbc, type & screen COVID test -----patient states asymptomatic no test needed Arrive at -------0530 on Monday, 05/16/2023 NPO after MN NO Solid Food.  Clear liquids from MN until---0430 Med rec completed Medications to take morning of surgery -----Xanax prn Diabetic medication -----n/a Patient instructed no nail polish to be worn day of surgery Patient instructed to bring photo id and insurance card day of surgery Patient aware to have Driver (ride ) / caregiver    for 24 hours after surgery - boyfriend- Molly Maduro and mother Patient Special Instructions -----Extended / overnight stay instructions given. Pre-Op special Instructions -----none Patient verbalized understanding of instructions that were given at this phone interview. Patient denies chest pain, sob, fever, cough at the interview.

## 2023-05-10 NOTE — Progress Notes (Signed)
Your procedure is scheduled on Monday, 05/16/2023.  Report to [facility] AT  5:30 AM.   Call this number if you have problems the morning of surgery  [phone].   OUR ADDRESS IS [address].  WE ARE LOCATED IN [facility].  PLEASE BRING YOUR INSURANCE CARD AND PHOTO ID DAY OF SURGERY.  ONLY 2 PEOPLE ARE ALLOWED IN  WAITING  ROOM                                      REMEMBER:  DO NOT EAT FOOD, CANDY GUM OR MINTS  AFTER MIDNIGHT THE NIGHT BEFORE YOUR SURGERY . YOU MAY HAVE CLEAR LIQUIDS FROM MIDNIGHT THE NIGHT BEFORE YOUR SURGERY UNTIL  4:30 AM. NO CLEAR LIQUIDS AFTER   4:30 AM DAY OF SURGERY.  YOU MAY  BRUSH YOUR TEETH MORNING OF SURGERY AND RINSE YOUR MOUTH OUT, NO CHEWING GUM CANDY OR MINTS.     CLEAR LIQUID DIET    Allowed      Water                                                                   Coffee and tea, regular and decaf  (NO cream or milk products of any type, may sweeten)                         Carbonated beverages, regular and diet                                    Sports drinks like Gatorade _____________________________________________________________________     TAKE ONLY THESE MEDICATIONS MORNING OF SURGERY: Xanax if needed                                        DO NOT WEAR JEWERLY/  METAL/  PIERCINGS (INCLUDING NO PLASTIC PIERCINGS) DO NOT WEAR LOTIONS, POWDERS, PERFUMES OR NAIL POLISH ON YOUR FINGERNAILS. TOENAIL POLISH IS OK TO WEAR. DO NOT SHAVE FOR 48 HOURS PRIOR TO DAY OF SURGERY.  CONTACTS, GLASSES, OR DENTURES MAY NOT BE WORN TO SURGERY.  REMEMBER: NO SMOKING, VAPING ,  DRUGS OR ALCOHOL FOR 24 HOURS BEFORE YOUR SURGERY.                                    [facility] IS NOT RESPONSIBLE  FOR ANY BELONGINGS.                                                                    Marland Kitchen           [facility] - Preparing for Surgery  Before surgery, you can play an important role.  Because skin is not  sterile, your skin needs to be as free of germs as possible.  You can reduce the number of germs on your skin by washing with CHG (chlorahexidine gluconate) soap before surgery.  CHG is an antiseptic cleaner which kills germs and bonds with the skin to continue killing germs even after washing. Please DO NOT use if you have an allergy to CHG or antibacterial soaps.  If your skin becomes reddened/irritated stop using the CHG and inform your nurse when you arrive at Short Stay. Do not shave (including legs and underarms) for at least 48 hours prior to the first CHG shower.  You may shave your face/neck. Please follow these instructions carefully:  1.  Shower with CHG Soap the night before surgery and the  morning of Surgery.  2.  If you choose to wash your hair, wash your hair first as usual with your  normal  shampoo.  3.  After you shampoo, rinse your hair and body thoroughly to remove the  shampoo.                                        4.  Use CHG as you would any other liquid soap.  You can apply chg directly  to the skin and wash , chg soap provided, night before and morning of your surgery.  5.  Apply the CHG Soap to your body ONLY FROM THE NECK DOWN.   Do not use on face/ open                           Wound or open sores. Avoid contact with eyes, ears mouth and genitals (private parts).                       Wash face,  Genitals (private parts) with your normal soap.             6.  Wash thoroughly, paying special attention to the area where your surgery  will be performed.  7.  Thoroughly rinse your body with warm water from the neck down.  8.  DO NOT shower/wash with your normal soap after using and rinsing off  the CHG Soap.             9.  Pat yourself dry with a clean towel.            10.  Wear clean pajamas.            11.  Place clean sheets on your bed the night of your first shower and do not  sleep with pets. Day of Surgery : Do not apply any lotions/ powders the morning of  surgery.  Please wear clean clothes to the hospital/surgery center.  IF YOU HAVE ANY SKIN IRRITATION OR PROBLEMS WITH THE SURGICAL SOAP, PLEASE GET A BAR OF GOLD DIAL SOAP AND SHOWER THE NIGHT BEFORE YOUR SURGERY AND THE MORNING OF YOUR SURGERY. PLEASE LET THE NURSE KNOW MORNING OF YOUR SURGERY IF YOU HAD ANY PROBLEMS WITH THE SURGICAL SOAP.   YOUR SURGEON MAY HAVE REQUESTED EXTENDED RECOVERY TIME AFTER YOUR SURGERY. IT COULD BE A  JUST A FEW HOURS  UP TO AN OVERNIGHT STAY.  YOUR SURGEON SHOULD HAVE DISCUSSED THIS WITH YOU PRIOR TO  YOUR SURGERY. IN THE EVENT YOU NEED TO STAY OVERNIGHT PLEASE REFER TO THE FOLLOWING GUIDELINES. YOU MAY HAVE UP TO 4 VISITORS  MAY VISIT IN THE EXTENDED RECOVERY ROOM UNTIL 800 PM ONLY.  ONE  VISITOR AGE 36 AND OVER MAY SPEND THE NIGHT AND MUST BE IN EXTENDED RECOVERY ROOM NO LATER THAN 800 PM . YOUR DISCHARGE TIME AFTER YOU SPEND THE NIGHT IS 900 AM THE MORNING AFTER YOUR SURGERY. YOU MAY PACK A SMALL OVERNIGHT BAG WITH TOILETRIES FOR YOUR OVERNIGHT STAY IF YOU WISH.  REGARDLESS OF IF YOU STAY OVER NIGHT OR ARE DISCHARGED THE SAME DAY YOU WILL BE REQUIRED TO HAVE A RESPONSIBLE ADULT (18 YRS OLD OR OLDER) STAY WITH YOU FOR AT LEAST THE FIRST 24 HOURS  YOUR PRESCRIPTION MEDICATIONS WILL BE PROVIDED DURING YOUR HOSPITAL STAY.  ________________________________________________________________________                                                        QUESTIONS Mechele Claude PRE OP NURSE PHONE [phone].

## 2023-05-11 ENCOUNTER — Encounter (HOSPITAL_COMMUNITY)
Admission: RE | Admit: 2023-05-11 | Discharge: 2023-05-11 | Disposition: A | Discharge status: Home / Self Care | Payer: BC Managed Care – PPO | Source: Ambulatory Visit | Attending: Obstetrics and Gynecology | Admitting: Obstetrics and Gynecology

## 2023-05-11 DIAGNOSIS — Z01812 Encounter for preprocedural laboratory examination: Secondary | ICD-10-CM | POA: Diagnosis not present

## 2023-05-11 DIAGNOSIS — Z01818 Encounter for other preprocedural examination: Secondary | ICD-10-CM

## 2023-05-11 LAB — CBC
HCT: 37.9 % (ref 36.0–46.0)
Hemoglobin: 12.2 g/dL (ref 12.0–15.0)
MCH: 27.5 pg (ref 26.0–34.0)
MCHC: 32.2 g/dL (ref 30.0–36.0)
MCV: 85.6 fL (ref 80.0–100.0)
Platelets: 336 10*3/uL (ref 150–400)
RBC: 4.43 MIL/uL (ref 3.87–5.11)
RDW: 13.5 % (ref 11.5–15.5)
WBC: 9.6 10*3/uL (ref 4.0–10.5)
nRBC: 0 % (ref 0.0–0.2)

## 2023-05-14 NOTE — Anesthesia Preprocedure Evaluation (Signed)
Anesthesia Evaluation  Patient identified by MRN, date of birth, ID band Patient awake    Reviewed: Allergy & Precautions, NPO status , Patient's Chart, lab work & pertinent test results  Airway Mallampati: I  TM Distance: >3 FB Neck ROM: Full    Dental no notable dental hx. (+) Teeth Intact, Dental Advisory Given   Pulmonary former smoker   Pulmonary exam normal breath sounds clear to auscultation       Cardiovascular Normal cardiovascular exam Rhythm:Regular Rate:Normal     Neuro/Psych  Headaches PSYCHIATRIC DISORDERS Anxiety Depression       GI/Hepatic negative GI ROS, Neg liver ROS,,,  Endo/Other    Renal/GU      Musculoskeletal   Abdominal   Peds  Hematology Lab Results      Component                Value               Date                      WBC                      9.6                 05/11/2023                HGB                      12.2                05/11/2023                HCT                      37.9                05/11/2023                MCV                      85.6                05/11/2023                PLT                      336                 05/11/2023              Anesthesia Other Findings   Reproductive/Obstetrics                             Anesthesia Physical Anesthesia Plan  ASA: 2  Anesthesia Plan: General   Post-op Pain Management: Ketamine IV*, Ofirmev IV (intra-op)* and Lidocaine infusion*   Induction: Intravenous  PONV Risk Score and Plan: 4 or greater and Treatment may vary due to age or medical condition, Midazolam, Ondansetron and Dexamethasone  Airway Management Planned: Oral ETT  Additional Equipment: None  Intra-op Plan:   Post-operative Plan: Extubation in OR  Informed Consent: I have reviewed the patients History and Physical, chart, labs and discussed the procedure including the risks, benefits and alternatives for the  proposed anesthesia with the patient or authorized representative who has  indicated his/her understanding and acceptance.     Dental advisory given  Plan Discussed with: CRNA  Anesthesia Plan Comments:         Anesthesia Quick Evaluation

## 2023-05-15 NOTE — H&P (Signed)
Taylor [name] is an 42 y.o. female P1 with history of endometriosis and chronic pelvic pain.   02/10/23: GYN scan for LLQ pain: 7.9cm anteverted uterus with 9mm luteal phase EMS. Left ovary with 4.1 x 3.3 x 2.9cm simple appearing ovarian cyst. Normal doppler flow. Normal right ovary.   Left ovarian cysts c/w patient's pain. Per patient, was told she had endo on her left ovary. Discussed will evaluate intraoperatively but anticipate LSO with right ovarian preservation. Patient agrees.  Ibuprofen for LLQ pain.   01/26/23: Endometriosis: discussed medical and surgical management options. 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 She had previously wanted to avoid surgery to keep option of pregnancy open but is now wanting to proceed with definitive surgery. We had a discussion re: hysterectomy with/without oophorectomy. Given tenderness in left adnexa, will get [place] will may help guide decision about oophorectomy. Has h/o pemphigoid flare with COC (estrogen can cause), so may lean toward ovarian preservation to avoid need for supplemental estrogen.    Pertinent Gynecological History: Menses: flow is moderate and with severe dysmenorrhea Bleeding: normal Contraception: coitus interruptus DES exposure: denies Blood transfusions: none Sexually transmitted diseases: no past history Previous GYN Procedures:  colposcopy, laparoscopic fulguration of endometriosis   Last mammogram: normal Date: 01/26/23 Last pap: normal Date: 08/19/20 OB History: G2, P1   Menstrual History: Patient's last menstrual period was 05/02/2023 (exact date).    Past Medical History:  Diagnosis Date   Anemia    history of   Anxiety    BP (bullous pemphigoid)    Follows w/ dermatology.   Depression    Follows w/ Barbette Merino, NP for anxiety and depression.   Endometriosis    Headache    migraines   IC (interstitial cystitis)    Syncope    Tachycardia     Follows w/ PCP. Happens occasionally. No meds.   Wears glasses     Past Surgical History:  Procedure Laterality Date   CESAREAN SECTION  03/01/2012   Procedure: CESAREAN SECTION;  Surgeon: Freddrick March. Tenny Craw, MD;  Location: [facility];  Service: Obstetrics;  Laterality: N/A;   COLONOSCOPY WITH ESOPHAGOGASTRODUODENOSCOPY (EGD)  2006   PELVIC LAPAROSCOPY  2010   TONSILLECTOMY      History reviewed. No pertinent family history.  Social History:  reports that she has quit smoking. Her smoking use included cigarettes. She has never used smokeless tobacco. She reports that she does not currently use alcohol. She reports that she does not use drugs.  Allergies: No Known Allergies  No medications prior to admission.    Review of Systems  Constitutional:  Negative for fever.  HENT:  Negative for sore throat.   Eyes:  Negative for visual disturbance.  Respiratory:  Negative for shortness of breath.   Cardiovascular:  Negative for chest pain.  Gastrointestinal:  Positive for abdominal pain. Negative for diarrhea.  Genitourinary:  Positive for pelvic pain.  Musculoskeletal:  Negative for myalgias.  Skin:  Negative for rash.  Neurological:  Negative for headaches.  Psychiatric/Behavioral:  Negative for suicidal ideas.     Height 5\' 4"  (1.626 m), weight 79.4 kg, last menstrual period 05/02/2023, not currently breastfeeding. Physical Exam  Chaperone Chaperone: present  Constitutional *General Appearance: healthy-appearing, well-nourished, well-developed  Head Head: normocephalic  Neck *Thyroid: no enlargement, no nodules, non-tender  Lymph Nodes *Palpation: non-tender supraclavicular nodes, non-tender axillary nodes, non-tender inguinal nodes  Cardiovascular *Auscultation: RRR, no murmur  Lungs *Respiratory  Effort: no accessory muscle usage, no intercostal retractions *Auscultation: clear to auscultation, no wheezing, no rales/crackles, no rhonchi  *Breast Bilateral: no skin  changes, nipple appearance: normal, no abnormal nipple secretions, no tenderness, no masses palpable  Abdomen *Inspection/Palpation/Auscultation: non-distended, no tenderness, no rebound, no guarding, soft  Female Genitalia Vulva: no masses, no atrophy, no lesions Mons: normal Labia Majora: normal, no erythema, no excoriation, no atrophy, no discoloration, no lesions Labia Minora: normal, no erythema, no excoriation, no atrophy, no discoloration, no lesions Introitus: normal *Vagina: no blood present, no erythema, no atrophy, no lesions, no ulcers, no masses, no tenderness, discharge present (pH 5.5, + whiff, + clue cells, no yeast or trich) *Cervix: grossly normal, no lesions, no discharge, no bleeding, no cervical motion tenderness *Uterus: normal size, normal contour, midline, mobile, non-tender, anteverted *Urethral Meatus/ Urethra: normal meatus *Adnexa/Parametria: no mass palpable, adnexal tenderness left  Extremities Legs: no calf tenderness  Neurological System Impressions: motor: no deficits, sensory: no deficits  Psychiatric *Orientation: to person, to place, to time *Mood and Affect: active and alert, normal mood, normal affect  No results found for this or any previous visit (from the past 24 hour(s)).  No results found.  Assessment/Plan: 42Y P1 with endometriosis and chronic pelvic pain - Plan: robot assisted total laparoscopic hysterectomy, bilateral salpingectomy, cystoscopy, possible unilateral or bilateral oophorectomy - Reviewed risks of procedure including pain, bleeding requiring blood transfusion, infection requiring antibiotics, injury to nearby organs (bowel, bladder, nerves, blood vessels, ureter), need for laparotomy to complete the operation, or failure to achieve desired results. - Ancef 2g  [name] 05/15/2023, 7:59 PM

## 2023-05-16 ENCOUNTER — Ambulatory Visit (HOSPITAL_BASED_OUTPATIENT_CLINIC_OR_DEPARTMENT_OTHER): Payer: BC Managed Care – PPO | Admitting: Anesthesiology

## 2023-05-16 ENCOUNTER — Ambulatory Visit (HOSPITAL_BASED_OUTPATIENT_CLINIC_OR_DEPARTMENT_OTHER)
Admission: RE | Admit: 2023-05-16 | Discharge: 2023-05-16 | Disposition: A | Discharge status: Home / Self Care | Payer: BC Managed Care – PPO | Attending: Obstetrics and Gynecology | Admitting: Obstetrics and Gynecology

## 2023-05-16 ENCOUNTER — Encounter (HOSPITAL_BASED_OUTPATIENT_CLINIC_OR_DEPARTMENT_OTHER)
Admission: RE | Disposition: A | Discharge status: Home / Self Care | Payer: Self-pay | Source: Home / Self Care | Attending: Obstetrics and Gynecology

## 2023-05-16 ENCOUNTER — Encounter (HOSPITAL_BASED_OUTPATIENT_CLINIC_OR_DEPARTMENT_OTHER): Payer: Self-pay | Admitting: Obstetrics and Gynecology

## 2023-05-16 ENCOUNTER — Ambulatory Visit (HOSPITAL_BASED_OUTPATIENT_CLINIC_OR_DEPARTMENT_OTHER): Payer: Self-pay | Admitting: Anesthesiology

## 2023-05-16 ENCOUNTER — Other Ambulatory Visit: Payer: Self-pay

## 2023-05-16 DIAGNOSIS — N72 Inflammatory disease of cervix uteri: Secondary | ICD-10-CM | POA: Insufficient documentation

## 2023-05-16 DIAGNOSIS — N840 Polyp of corpus uteri: Secondary | ICD-10-CM | POA: Insufficient documentation

## 2023-05-16 DIAGNOSIS — D251 Intramural leiomyoma of uterus: Secondary | ICD-10-CM | POA: Diagnosis not present

## 2023-05-16 DIAGNOSIS — N83202 Unspecified ovarian cyst, left side: Secondary | ICD-10-CM | POA: Diagnosis present

## 2023-05-16 DIAGNOSIS — Z87891 Personal history of nicotine dependence: Secondary | ICD-10-CM | POA: Diagnosis not present

## 2023-05-16 DIAGNOSIS — N808 Other endometriosis: Secondary | ICD-10-CM | POA: Insufficient documentation

## 2023-05-16 DIAGNOSIS — R102 Pelvic and perineal pain: Secondary | ICD-10-CM | POA: Insufficient documentation

## 2023-05-16 DIAGNOSIS — N879 Dysplasia of cervix uteri, unspecified: Secondary | ICD-10-CM | POA: Diagnosis not present

## 2023-05-16 DIAGNOSIS — N83292 Other ovarian cyst, left side: Secondary | ICD-10-CM | POA: Insufficient documentation

## 2023-05-16 DIAGNOSIS — G8929 Other chronic pain: Secondary | ICD-10-CM | POA: Diagnosis not present

## 2023-05-16 DIAGNOSIS — Z90710 Acquired absence of both cervix and uterus: Secondary | ICD-10-CM | POA: Diagnosis present

## 2023-05-16 DIAGNOSIS — Z01818 Encounter for other preprocedural examination: Secondary | ICD-10-CM

## 2023-05-16 HISTORY — DX: Endometriosis, unspecified: N80.9

## 2023-05-16 HISTORY — DX: Presence of spectacles and contact lenses: Z97.3

## 2023-05-16 HISTORY — PX: CYSTOSCOPY: SHX5120

## 2023-05-16 HISTORY — DX: Headache, unspecified: R51.9

## 2023-05-16 HISTORY — DX: Bullous pemphigoid: L12.0

## 2023-05-16 HISTORY — DX: Anemia, unspecified: D64.9

## 2023-05-16 HISTORY — PX: ROBOTIC ASSISTED TOTAL HYSTERECTOMY WITH BILATERAL SALPINGO OOPHERECTOMY: SHX6086

## 2023-05-16 LAB — TYPE AND SCREEN
ABO/RH(D): A POS
Antibody Screen: NEGATIVE

## 2023-05-16 LAB — POCT PREGNANCY, URINE: Preg Test, Ur: NEGATIVE

## 2023-05-16 SURGERY — HYSTERECTOMY, TOTAL, ROBOT-ASSISTED, LAPAROSCOPIC, WITH BILATERAL SALPINGO-OOPHORECTOMY
Anesthesia: General | Site: Pelvis

## 2023-05-16 MED ORDER — KETAMINE HCL 10 MG/ML IJ SOLN
INTRAMUSCULAR | Status: DC | PRN
  Administered 2023-05-16 (×3): 10 mg via INTRAVENOUS

## 2023-05-16 MED ORDER — KETOROLAC TROMETHAMINE 30 MG/ML IJ SOLN
INTRAMUSCULAR | Status: DC | PRN
  Administered 2023-05-16: 30 mg via INTRAVENOUS

## 2023-05-16 MED ORDER — CEFAZOLIN SODIUM-DEXTROSE 2-4 GM/100ML-% IV SOLN
2.0000 g | INTRAVENOUS | Status: AC
  Administered 2023-05-16: 2 g via INTRAVENOUS

## 2023-05-16 MED ORDER — BUPIVACAINE HCL (PF) 0.25 % IJ SOLN
INTRAMUSCULAR | Status: DC | PRN
  Administered 2023-05-16: 10 mL

## 2023-05-16 MED ORDER — KETOROLAC TROMETHAMINE 30 MG/ML IJ SOLN
INTRAMUSCULAR | Status: AC
Start: 2023-05-16 — End: ?
  Filled 2023-05-16: qty 1

## 2023-05-16 MED ORDER — SUGAMMADEX SODIUM 200 MG/2ML IV SOLN
INTRAVENOUS | Status: DC | PRN
  Administered 2023-05-16: 200 mg via INTRAVENOUS

## 2023-05-16 MED ORDER — MIDAZOLAM HCL 2 MG/2ML IJ SOLN
INTRAMUSCULAR | Status: AC
Start: 2023-05-16 — End: ?
  Filled 2023-05-16: qty 2

## 2023-05-16 MED ORDER — SIMETHICONE 80 MG PO CHEW
CHEWABLE_TABLET | ORAL | Status: AC
  Filled 2023-05-16: qty 1

## 2023-05-16 MED ORDER — ONDANSETRON HCL 4 MG/2ML IJ SOLN
INTRAMUSCULAR | Status: DC | PRN
  Administered 2023-05-16: 4 mg via INTRAVENOUS

## 2023-05-16 MED ORDER — STERILE WATER FOR IRRIGATION IR SOLN
Status: DC | PRN
  Administered 2023-05-16: 1000 mL

## 2023-05-16 MED ORDER — ONDANSETRON HCL 4 MG/2ML IJ SOLN
INTRAMUSCULAR | Status: AC
Start: 2023-05-16 — End: ?
  Filled 2023-05-16: qty 2

## 2023-05-16 MED ORDER — LIDOCAINE 2% (20 MG/ML) 5 ML SYRINGE
INTRAMUSCULAR | Status: DC | PRN
  Administered 2023-05-16: 40 mg via INTRAVENOUS

## 2023-05-16 MED ORDER — ONDANSETRON HCL 4 MG/2ML IJ SOLN: 4.0000 mg | Freq: Once | INTRAMUSCULAR | Status: DC | PRN

## 2023-05-16 MED ORDER — FENTANYL CITRATE (PF) 250 MCG/5ML IJ SOLN
INTRAMUSCULAR | Status: DC | PRN
  Administered 2023-05-16: 25 ug via INTRAVENOUS
  Administered 2023-05-16 (×2): 50 ug via INTRAVENOUS
  Administered 2023-05-16: 25 ug via INTRAVENOUS
  Administered 2023-05-16: 50 ug via INTRAVENOUS

## 2023-05-16 MED ORDER — ACETAMINOPHEN 500 MG PO TABS
1000.0000 mg | ORAL_TABLET | ORAL | Status: AC
  Administered 2023-05-16: 1000 mg via ORAL

## 2023-05-16 MED ORDER — DOCUSATE SODIUM 100 MG PO CAPS
ORAL_CAPSULE | ORAL | Status: AC
Start: 2023-05-16 — End: ?
  Filled 2023-05-16: qty 1

## 2023-05-16 MED ORDER — OXYCODONE HCL 5 MG PO TABS
5.0000 mg | ORAL_TABLET | ORAL | Status: DC | PRN
  Administered 2023-05-16 (×2): 5 mg via ORAL

## 2023-05-16 MED ORDER — SODIUM CHLORIDE 0.9 % IV SOLN
INTRAVENOUS | Status: DC | PRN
  Administered 2023-05-16: 60 mL

## 2023-05-16 MED ORDER — ROCURONIUM BROMIDE 10 MG/ML (PF) SYRINGE
PREFILLED_SYRINGE | INTRAVENOUS | Status: DC | PRN
  Administered 2023-05-16: 60 mg via INTRAVENOUS

## 2023-05-16 MED ORDER — OXYCODONE HCL 5 MG PO TABS
ORAL_TABLET | ORAL | Status: AC
  Filled 2023-05-16: qty 1

## 2023-05-16 MED ORDER — DOCUSATE SODIUM 100 MG PO CAPS
100.0000 mg | ORAL_CAPSULE | Freq: Two times a day (BID) | ORAL | Status: DC
  Administered 2023-05-16: 100 mg via ORAL

## 2023-05-16 MED ORDER — HYDROMORPHONE HCL 1 MG/ML IJ SOLN: 0.5000 mg | INTRAMUSCULAR | Status: DC | PRN

## 2023-05-16 MED ORDER — PROPOFOL 10 MG/ML IV BOLUS
INTRAVENOUS | Status: DC | PRN
  Administered 2023-05-16: 130 mg via INTRAVENOUS

## 2023-05-16 MED ORDER — SODIUM CHLORIDE 0.9 % IR SOLN
Status: DC | PRN
  Administered 2023-05-16: 1000 mL

## 2023-05-16 MED ORDER — FENTANYL CITRATE (PF) 250 MCG/5ML IJ SOLN
INTRAMUSCULAR | Status: AC
  Filled 2023-05-16: qty 5

## 2023-05-16 MED ORDER — ACETAMINOPHEN 500 MG PO TABS
ORAL_TABLET | ORAL | Status: AC
  Filled 2023-05-16: qty 2

## 2023-05-16 MED ORDER — KETOROLAC TROMETHAMINE 30 MG/ML IJ SOLN
30.0000 mg | Freq: Four times a day (QID) | INTRAMUSCULAR | Status: DC
  Administered 2023-05-16: 30 mg via INTRAVENOUS

## 2023-05-16 MED ORDER — FLUORESCEIN SODIUM 10 % IV SOLN
INTRAVENOUS | Status: DC | PRN
  Administered 2023-05-16: 10 mg via INTRAVENOUS

## 2023-05-16 MED ORDER — KETAMINE HCL 50 MG/5ML IJ SOSY
PREFILLED_SYRINGE | INTRAMUSCULAR | Status: AC
  Filled 2023-05-16: qty 5

## 2023-05-16 MED ORDER — LIDOCAINE HCL (PF) 2 % IJ SOLN
INTRAMUSCULAR | Status: AC
Start: 2023-05-16 — End: ?
  Filled 2023-05-16: qty 5

## 2023-05-16 MED ORDER — PROPOFOL 10 MG/ML IV BOLUS
INTRAVENOUS | Status: AC
Start: 2023-05-16 — End: ?
  Filled 2023-05-16: qty 20

## 2023-05-16 MED ORDER — ROCURONIUM BROMIDE 10 MG/ML (PF) SYRINGE
PREFILLED_SYRINGE | INTRAVENOUS | Status: AC
Start: 2023-05-16 — End: ?
  Filled 2023-05-16: qty 10

## 2023-05-16 MED ORDER — LACTATED RINGERS IV SOLN: INTRAVENOUS | Status: DC

## 2023-05-16 MED ORDER — SIMETHICONE 80 MG PO CHEW
40.0000 mg | CHEWABLE_TABLET | Freq: Four times a day (QID) | ORAL | Status: DC | PRN
  Administered 2023-05-16: 40 mg via ORAL

## 2023-05-16 MED ORDER — OXYCODONE HCL 5 MG PO TABS: 5.0000 mg | ORAL_TABLET | Freq: Once | ORAL | Status: DC | PRN

## 2023-05-16 MED ORDER — ACETAMINOPHEN 500 MG PO TABS
1000.0000 mg | ORAL_TABLET | Freq: Four times a day (QID) | ORAL | Status: DC
  Administered 2023-05-16: 1000 mg via ORAL

## 2023-05-16 MED ORDER — OXYCODONE HCL 5 MG/5ML PO SOLN: 5.0000 mg | Freq: Once | ORAL | Status: DC | PRN

## 2023-05-16 MED ORDER — KETOROLAC TROMETHAMINE 30 MG/ML IJ SOLN: 30.0000 mg | Freq: Once | INTRAMUSCULAR | Status: DC | PRN

## 2023-05-16 MED ORDER — DEXAMETHASONE SODIUM PHOSPHATE 10 MG/ML IJ SOLN
INTRAMUSCULAR | Status: AC
  Filled 2023-05-16: qty 1

## 2023-05-16 MED ORDER — ONDANSETRON HCL 4 MG/2ML IJ SOLN: 4.0000 mg | Freq: Four times a day (QID) | INTRAMUSCULAR | Status: DC | PRN

## 2023-05-16 MED ORDER — MIDAZOLAM HCL 2 MG/2ML IJ SOLN
INTRAMUSCULAR | Status: DC | PRN
  Administered 2023-05-16: 2 mg via INTRAVENOUS

## 2023-05-16 MED ORDER — PHENYLEPHRINE 80 MCG/ML (10ML) SYRINGE FOR IV PUSH (FOR BLOOD PRESSURE SUPPORT)
PREFILLED_SYRINGE | INTRAVENOUS | Status: AC
  Filled 2023-05-16: qty 10

## 2023-05-16 MED ORDER — ACETAMINOPHEN 325 MG PO TABS: 650.0000 mg | ORAL_TABLET | Freq: Four times a day (QID) | ORAL | Status: AC | PRN

## 2023-05-16 MED ORDER — ACETAMINOPHEN 500 MG PO TABS
ORAL_TABLET | ORAL | Status: AC
Start: 2023-05-16 — End: ?
  Filled 2023-05-16: qty 2

## 2023-05-16 MED ORDER — DIPHENHYDRAMINE HCL 50 MG/ML IJ SOLN
INTRAMUSCULAR | Status: DC | PRN
  Administered 2023-05-16: 12.5 mg via INTRAVENOUS

## 2023-05-16 MED ORDER — DIPHENHYDRAMINE HCL 50 MG/ML IJ SOLN
INTRAMUSCULAR | Status: AC
Start: 2023-05-16 — End: ?
  Filled 2023-05-16: qty 1

## 2023-05-16 MED ORDER — HYDROMORPHONE HCL 1 MG/ML IJ SOLN
INTRAMUSCULAR | Status: AC
  Filled 2023-05-16: qty 1

## 2023-05-16 MED ORDER — HYDROMORPHONE HCL 1 MG/ML IJ SOLN
0.2500 mg | INTRAMUSCULAR | Status: DC | PRN
  Administered 2023-05-16: 0.25 mg via INTRAVENOUS

## 2023-05-16 MED ORDER — DEXAMETHASONE SODIUM PHOSPHATE 10 MG/ML IJ SOLN
INTRAMUSCULAR | Status: DC | PRN
  Administered 2023-05-16: 10 mg via INTRAVENOUS

## 2023-05-16 MED ORDER — CEFAZOLIN SODIUM-DEXTROSE 2-4 GM/100ML-% IV SOLN
INTRAVENOUS | Status: AC
Start: 2023-05-16 — End: ?
  Filled 2023-05-16: qty 100

## 2023-05-16 MED ORDER — ONDANSETRON 4 MG PO TBDP: 4.0000 mg | ORAL_TABLET | Freq: Four times a day (QID) | ORAL | Status: DC | PRN

## 2023-05-16 SURGICAL SUPPLY — 56 items
BARRIER ADHS 3X4 INTERCEED (GAUZE/BANDAGES/DRESSINGS) IMPLANT
COVER BACK TABLE 60X90IN (DRAPES) ×2 IMPLANT
COVER TIP SHEARS 8 DVNC (MISCELLANEOUS) ×2 IMPLANT
DEFOGGER SCOPE WARMER CLEARIFY (MISCELLANEOUS) ×2 IMPLANT
DERMABOND ADVANCED .7 DNX12 (GAUZE/BANDAGES/DRESSINGS) ×2 IMPLANT
DRAPE ARM DVNC X/XI (DISPOSABLE) ×8 IMPLANT
DRAPE COLUMN DVNC XI (DISPOSABLE) ×2 IMPLANT
DRAPE SURG IRRIG POUCH 19X23 (DRAPES) ×2 IMPLANT
DRAPE UTILITY XL STRL (DRAPES) ×2 IMPLANT
DRIVER NDL MEGA SUTCUT DVNCXI (INSTRUMENTS) ×2 IMPLANT
DRIVER NDLE MEGA SUTCUT DVNCXI (INSTRUMENTS) ×2
DURAPREP 26ML APPLICATOR (WOUND CARE) ×2 IMPLANT
ELECT REM PT RETURN 9FT ADLT (ELECTROSURGICAL) ×2
ELECTRODE REM PT RTRN 9FT ADLT (ELECTROSURGICAL) ×2 IMPLANT
FORCEPS BPLR FENES DVNC XI (FORCEP) ×2 IMPLANT
FORCEPS PROGRASP DVNC XI (FORCEP) IMPLANT
FORCEPS TENACULUM DVNC XI (FORCEP) IMPLANT
GAUZE 4X4 16PLY ~~LOC~~+RFID DBL (SPONGE) IMPLANT
GLOVE BIO SURGEON STRL SZ 6 (GLOVE) ×6 IMPLANT
GLOVE BIOGEL PI IND STRL 6.5 (GLOVE) ×6 IMPLANT
HOLDER FOLEY CATH W/STRAP (MISCELLANEOUS) IMPLANT
IRRIG SUCT STRYKERFLOW 2 WTIP (MISCELLANEOUS) ×2
IRRIGATION SUCT STRKRFLW 2 WTP (MISCELLANEOUS) ×2 IMPLANT
IV NS 1000ML BAXH (IV SOLUTION) IMPLANT
KIT PINK PAD W/HEAD ARE REST (MISCELLANEOUS) ×2
KIT PINK PAD W/HEAD ARM REST (MISCELLANEOUS) ×2 IMPLANT
KIT TURNOVER CYSTO (KITS) ×2 IMPLANT
LEGGING LITHOTOMY PAIR STRL (DRAPES) ×2 IMPLANT
MANIPULATOR ADVINCU DEL 2.5 PL (MISCELLANEOUS) IMPLANT
MANIPULATOR ADVINCU DEL 3.0 PL (MISCELLANEOUS) IMPLANT
MANIPULATOR ADVINCU DEL 3.5 PL (MISCELLANEOUS) IMPLANT
MANIPULATOR ADVINCU DEL 4.0 PL (MISCELLANEOUS) IMPLANT
NDL INSUFFLATION 14GA 120MM (NEEDLE) ×2 IMPLANT
NEEDLE INSUFFLATION 14GA 120MM (NEEDLE) ×2
OBTURATOR OPTICAL STND 8 DVNC (TROCAR) ×2
OBTURATOR OPTICALSTD 8 DVNC (TROCAR) ×2 IMPLANT
PACK ROBOT WH (CUSTOM PROCEDURE TRAY) ×2 IMPLANT
PACK ROBOTIC GOWN (GOWN DISPOSABLE) ×2 IMPLANT
PAD OB MATERNITY 4.3X12.25 (PERSONAL CARE ITEMS) ×2 IMPLANT
PAD PREP 24X48 CUFFED NSTRL (MISCELLANEOUS) ×2 IMPLANT
POUCH LAPAROSCOPIC INSTRUMENT (MISCELLANEOUS) IMPLANT
PROTECTOR NERVE ULNAR (MISCELLANEOUS) ×4 IMPLANT
SCISSORS MNPLR CVD DVNC XI (INSTRUMENTS) ×2 IMPLANT
SEAL UNIV 5-12 XI (MISCELLANEOUS) ×6 IMPLANT
SET IRRIG Y TYPE TUR BLADDER L (SET/KITS/TRAYS/PACK) ×2 IMPLANT
SET TRI-LUMEN FLTR TB AIRSEAL (TUBING) ×2 IMPLANT
SLEEVE SCD COMPRESS KNEE MED (STOCKING) ×2 IMPLANT
SPIKE FLUID TRANSFER (MISCELLANEOUS) ×2 IMPLANT
SUT VIC AB 0 CT1 36 (SUTURE) ×2 IMPLANT
SUT VICRYL RAPIDE 3 0 (SUTURE) ×4 IMPLANT
SUT VLOC 180 0 9IN GS21 (SUTURE) ×2 IMPLANT
TOWEL OR 17X24 6PK STRL BLUE (TOWEL DISPOSABLE) ×2 IMPLANT
TRAY FOLEY W/BAG SLVR 14FR LF (SET/KITS/TRAYS/PACK) ×2 IMPLANT
TROCAR PORT AIRSEAL 5X120 (TROCAR) ×2 IMPLANT
TROCAR Z-THREAD FIOS 5X100MM (TROCAR) IMPLANT
WATER STERILE IRR 1000ML POUR (IV SOLUTION) ×2 IMPLANT

## 2023-05-16 NOTE — Op Note (Addendum)
05/16/2023   9:15 AM   PATIENT:  Taylor [name]  42 y.o. female   PRE-OPERATIVE DIAGNOSIS:  endometriosis of pelvis, chronic pelvic pain, left ovarian cyst   POST-OPERATIVE DIAGNOSIS:  endometriosis of pelvis, chronic pelvic pain, left ovarian cyst   PROCEDURE:  Procedure(s): XI ROBOTIC ASSISTED TOTAL HYSTERECTOMY WITH BILATERAL SALPINGECTOMY AND LEFT OOPHORECTOMY (N/A) CYSTOSCOPY (N/A)   SURGEON:  Surgeons and Role:    * [name], MD - Primary    * Carrington Clamp, MD - Assisting   ANESTHESIA:   local and general   EBL:  25 mL    BLOOD ADMINISTERED:none   DRAINS: Urinary Catheter (Foley)    LOCAL MEDICATIONS USED: Marcaine and Ropivicaine   SPECIMEN:  Source of Specimen:  uterus, cervix, bilateral fallopian tubes, left ovary   DISPOSITION OF SPECIMEN:  PATHOLOGY   COUNTS:  YES  COMPLICATIONS: none  FINDINGS:  On bimanual exam, anteverted uterus sounded to 8cm.  On laparoscopic view, filmy omental adhesions to right abdomen and pelvis. Normal appearing uterus with mild bladder flap adhesions status post cesarean. Right side of posterior cul-de-sac with scarring consistent with previous endometriosis fulguration. Red endometriosis lesion left of cervicovaginal junction. Bilateral fallopian tubes normal appearing. Left ovary with 3cm cyst. Normal appearing right ovary. Normal appearing ureters bilaterally. Grossly normal appendix and upper abdominal survey.  Cystoscopy demonstrated intact bladder and bilateral ureteral jets.    DESCRIPTION OF PROCEDURE: After consent was verified the patient was taken to the operating room.  After adequate anesthesia was achieved the patient was positioned in the dorsal lithotomy position with legs in stirrups. SCDs were in place and cycling.  Exam as aboved. The patient was prepped and draped in normal sterile fashion. Ancef 2g was given for infection prophylaxis. A speculum was placed in the vagina and the anterior lip of the  cervix was grasped with a tenaculum. A stay suture was placed on the anterior lip of the cervix. The uterus was sounded to 8cm and the cervix dilated with [facility] dilators.  The 3.5 cm delineating ring was assembled and placed in the proper fashion.  The speculum was removed and the bladder was drained with a Foley catheter.   Attention was turned to the abdomen. An 8mm incision was made at the superior umbilicus and the Veress needle was inserted through the incision and due to high opening pressuring times two, direct laparoscopic entry was utilized. The abdomen was insufflated with CO2 gas to a total pressure of . The 8 mm robotic trochar was inserted into the abdominal cavity. The laparoscope was inserted and confirmed no vascular or visceral trauma from entry. The patient was placed in steep Trendelenburg.  Two additional 8mm robotic trochars were placed, one on each side of the abdomen. An 5mm Airseal assistant port was placed on the left side. All trochars were inserted under direct visualization of the camera.  The robot was docked.  The fenestrated bipolar was placed on arm 1 and the monopolar scissors on arm 3 and introduced under direct visualization of the camera.   Survey of the abdomen revealed findings as noted above. Filmy omental adhesions to the right abdomen and pelvic walls were ligated. Bilateral  ureters were visualized peristalsing. The right mesosalpinx was serially ligated to free the right fallopian tube.  The right round ligament was transected.  The vesicouterine peritoneum was opened and the bladder was dissected off the lower uterine segment and upper vagina.  The left round ligament was transected and  peritoneum opened parallel and lateral to the left infundibulopelvic ligament. The infundibulopelvic ligament was isolated and ligated. The left posterior broad ligament was skeletonized. The same process was repeated on the right side. The left uterine vessels were dessicated  with bipolar cautery and transected with monopolar scissors. The right uterine vessels were transected in the same fashion. The cervicovaginal junction was identified with the delineating ring and the monopolar scissors were used to enter the vagina and incise circumferentially around the cervicovaginal junction. The uterus, cervix, bilateral fallopian tubes, and left ovary were removed through the vagina and sent for pathology. The vaginal cuff was closed with V loc suture in a running fashion. The suture was removed. An assistant palpated the vaginal cuff from the vaginal field and confirmed the vaginal cuff was intact without defect. The abdomen was irrigated and suctioned. Arista was applied to the vesicouterine peritoneum which was oozing slightly. The pelvis was observed to be hemostatic at and . The robot was undocked and Ropivicaine was introduced into the pelvis. All ports were removed. Fluorescein was administered.    The port sites were closed with 3-0 vicryl in a subcuticular fashion, infiltrated with 0.25% marcaine and covered with skin glue.  The foley catheter was removed. A cystoscope was inserted into the bladder and the bladder was distended with saline. Survey revealed intact bladder and bilateral ureteral jets were appreciated. The bladder was drained and foley reintroduced. A speculum was inserted into the vagina and vaginal cuff appeared intact and hemostatic. A manual exam again confirmed the vaginal cuff was intact without defect.    The patient was awakened from anesthesia and taken to the recovery room in stable condition.  Derl Barrow MD 05/16/23 9:33 AM

## 2023-05-16 NOTE — Brief Op Note (Signed)
05/16/2023  9:15 AM  PATIENT:  Taylor [name]  42 y.o. female  PRE-OPERATIVE DIAGNOSIS:  endometriosis of pelvis, chronic pelvic pain, left ovarian cyst  POST-OPERATIVE DIAGNOSIS:  endometriosis of pelvis, chronic pelvic pain, left ovarian cyst  PROCEDURE:  Procedure(s): XI ROBOTIC ASSISTED TOTAL HYSTERECTOMY WITH BILATERAL SALPINGECTOMY AND LEFT OOPHORECTOMY (N/A) CYSTOSCOPY (N/A)  SURGEON:  Surgeons and Role:    * [name], MD - Primary    * Carrington Clamp, MD - Assisting  ANESTHESIA:   local and general  EBL:  25 mL   BLOOD ADMINISTERED:none  DRAINS: Urinary Catheter (Foley)   LOCAL MEDICATIONS USED: Marcaine and Ropivicaine  SPECIMEN:  Source of Specimen:  uterus, cervix, bilateral fallopian tubes, left ovary  DISPOSITION OF SPECIMEN:  PATHOLOGY  COUNTS:  YES  TOURNIQUET:  * No tourniquets in log *  DICTATION: .Note written in EPIC  PLAN OF CARE: Admit for overnight observation  PATIENT DISPOSITION:  PACU - hemodynamically stable.   Delay start of Pharmacological VTE agent (>24hrs) due to surgical blood loss or risk of bleeding: yes

## 2023-05-16 NOTE — Anesthesia Procedure Notes (Signed)
Procedure Name: Intubation Date/Time: 05/16/2023 7:36 AM  Performed by: Dairl Ponder, CRNAPre-anesthesia Checklist: Patient identified, Emergency Drugs available, Suction available and Patient being monitored Patient Re-evaluated:Patient Re-evaluated prior to induction Oxygen Delivery Method: Circle System Utilized Preoxygenation: Pre-oxygenation with 100% oxygen Induction Type: IV induction Ventilation: Mask ventilation without difficulty Laryngoscope Size: Mac and 3 Grade View: Grade I Tube type: Oral Tube size: 7.0 mm Number of attempts: 1 Airway Equipment and Method: Stylet and Oral airway Placement Confirmation: ETT inserted through vocal cords under direct vision, positive ETCO2 and breath sounds checked- equal and bilateral Secured at: 22 cm Tube secured with: Tape Dental Injury: Teeth and Oropharynx as per pre-operative assessment

## 2023-05-16 NOTE — Interval H&P Note (Signed)
History and Physical Interval Note:  05/16/2023 7:10 AM  Taylor [name]  has presented today for surgery, with the diagnosis of endometriosis of pelvis.  The various methods of treatment have been discussed with the patient and family. After consideration of risks, benefits and other options for treatment, the patient has consented to  ROBOT ASSISTED TOTAL LAPAROSCOPIC HYSTERECTOMY, BILATERAL SALPINGECTOMY, CYSTOSCOPY, POSSIBLE UNILATERAL OR BILATERAL OOPHORECTOMY as a surgical intervention.  The patient's history has been reviewed, patient examined, no change in status, stable for surgery.  I have reviewed the patient's chart and labs.  Questions were answered to the patient's satisfaction.     [name]

## 2023-05-16 NOTE — Anesthesia Postprocedure Evaluation (Signed)
Anesthesia Post Note  Patient: RAVEEN STAMOS  Procedure(s) Performed: XI ROBOTIC ASSISTED TOTAL HYSTERECTOMY WITH BILATERAL SALPINGECTOMY AND LEFT OOPHORECTOMY (Pelvis) CYSTOSCOPY (Bladder)     Patient location during evaluation: PACU Anesthesia Type: General Level of consciousness: awake and alert Pain management: pain level controlled Vital Signs Assessment: post-procedure vital signs reviewed and stable Respiratory status: spontaneous breathing, nonlabored ventilation, respiratory function stable and patient connected to nasal cannula oxygen Cardiovascular status: blood pressure returned to baseline and stable Postop Assessment: no apparent nausea or vomiting Anesthetic complications: no   No notable events documented.  Last Vitals:  Vitals:   05/16/23 1145 05/16/23 1300  BP: 117/80 114/74  Pulse: 81 92  Resp: 16 17  Temp: 36.7 C 36.8 C  SpO2: 100% 97%    Last Pain:  Vitals:   05/16/23 1145  TempSrc:   PainSc: 6                  Trevor Iha

## 2023-05-16 NOTE — Transfer of Care (Signed)
Immediate Anesthesia Transfer of Care Note  Patient: Taylor [name]  Procedure(s) Performed: XI ROBOTIC ASSISTED TOTAL HYSTERECTOMY WITH BILATERAL SALPINGECTOMY AND LEFT OOPHORECTOMY (Pelvis) CYSTOSCOPY (Bladder)  Patient Location: PACU  Anesthesia Type:General  Level of Consciousness: drowsy and patient cooperative  Airway & Oxygen Therapy: Patient Spontanous Breathing  Post-op Assessment: Report given to RN and Post -op Vital signs reviewed and stable  Post vital signs: Reviewed and stable  Last Vitals:  Vitals Value Taken Time  BP 117/81 05/16/23 0930  Temp    Pulse 93 05/16/23 0936  Resp 13 05/16/23 0936  SpO2 100 % 05/16/23 0936  Vitals shown include unfiled device data.  Last Pain:  Vitals:   05/16/23 0605  TempSrc: Oral  PainSc: 5       Patients Stated Pain Goal: 5 (05/16/23 8295)  Complications: No notable events documented.

## 2023-05-17 LAB — SURGICAL PATHOLOGY

## 2023-05-18 ENCOUNTER — Encounter (HOSPITAL_BASED_OUTPATIENT_CLINIC_OR_DEPARTMENT_OTHER): Payer: Self-pay | Admitting: Obstetrics and Gynecology
